# Patient Record
Sex: Male | Born: 1980
Health system: Southern US, Community
[De-identification: ages and names within clinical notes are randomized; demographics above are authoritative.]

## PROBLEM LIST (undated history)

## (undated) DIAGNOSIS — Z720 Tobacco use: Secondary | ICD-10-CM

## (undated) DIAGNOSIS — E119 Type 2 diabetes mellitus without complications: Secondary | ICD-10-CM

## (undated) DIAGNOSIS — I10 Essential (primary) hypertension: Secondary | ICD-10-CM

## (undated) DIAGNOSIS — D72829 Elevated white blood cell count, unspecified: Secondary | ICD-10-CM

## (undated) HISTORY — PX: PILONIDAL CYST EXCISION: SHX744

## (undated) HISTORY — PX: CHOLECYSTECTOMY: SHX55

## (undated) HISTORY — PX: APPENDECTOMY: SHX54

## (undated) HISTORY — PX: ABDOMINAL SURGERY: SHX537

---

## 2001-03-29 ENCOUNTER — Ambulatory Visit (HOSPITAL_BASED_OUTPATIENT_CLINIC_OR_DEPARTMENT_OTHER): Admission: RE | Admit: 2001-03-29 | Discharge: 2001-03-29 | Payer: Self-pay | Admitting: General Surgery

## 2006-01-03 ENCOUNTER — Emergency Department (HOSPITAL_COMMUNITY): Admission: EM | Admit: 2006-01-03 | Discharge: 2006-01-03 | Payer: Self-pay | Admitting: Emergency Medicine

## 2006-01-24 ENCOUNTER — Emergency Department (HOSPITAL_COMMUNITY): Admission: EM | Admit: 2006-01-24 | Discharge: 2006-01-24 | Payer: Self-pay | Admitting: Emergency Medicine

## 2011-07-26 ENCOUNTER — Inpatient Hospital Stay (HOSPITAL_COMMUNITY)
Admission: EM | Admit: 2011-07-26 | Discharge: 2011-07-29 | DRG: 603 | Disposition: A | Discharge status: Home / Self Care | Payer: MEDICAID | Attending: Internal Medicine | Admitting: Internal Medicine

## 2011-07-26 ENCOUNTER — Encounter (HOSPITAL_COMMUNITY): Payer: Self-pay | Admitting: Emergency Medicine

## 2011-07-26 DIAGNOSIS — L03319 Cellulitis of trunk, unspecified: Principal | ICD-10-CM | POA: Diagnosis present

## 2011-07-26 DIAGNOSIS — Z720 Tobacco use: Secondary | ICD-10-CM

## 2011-07-26 DIAGNOSIS — L02219 Cutaneous abscess of trunk, unspecified: Principal | ICD-10-CM

## 2011-07-26 DIAGNOSIS — N179 Acute kidney failure, unspecified: Secondary | ICD-10-CM

## 2011-07-26 DIAGNOSIS — L0291 Cutaneous abscess, unspecified: Secondary | ICD-10-CM

## 2011-07-26 DIAGNOSIS — F17200 Nicotine dependence, unspecified, uncomplicated: Secondary | ICD-10-CM

## 2011-07-26 DIAGNOSIS — L03311 Cellulitis of abdominal wall: Secondary | ICD-10-CM

## 2011-07-26 DIAGNOSIS — Z9089 Acquired absence of other organs: Secondary | ICD-10-CM

## 2011-07-26 HISTORY — DX: Tobacco use: Z72.0

## 2011-07-26 LAB — CBC WITH DIFFERENTIAL/PLATELET
Basophils Relative: 0 % (ref 0–1)
Eosinophils Absolute: 0.3 10*3/uL (ref 0.0–0.7)
Eosinophils Relative: 2 % (ref 0–5)
HCT: 45.1 % (ref 39.0–52.0)
Hemoglobin: 14.6 g/dL (ref 13.0–17.0)
Lymphocytes Relative: 15 % (ref 12–46)
Lymphs Abs: 2.3 10*3/uL (ref 0.7–4.0)
MCHC: 32.4 g/dL (ref 30.0–36.0)
Neutro Abs: 11.1 10*3/uL — ABNORMAL HIGH (ref 1.7–7.7)
WBC: 14.8 10*3/uL — ABNORMAL HIGH (ref 4.0–10.5)

## 2011-07-26 LAB — BASIC METABOLIC PANEL
Creatinine, Ser: 0.9 mg/dL (ref 0.50–1.35)
GFR calc non Af Amer: >90 mL/min (ref >90)
Potassium: 4 mEq/L (ref 3.5–5.1)

## 2011-07-26 MED ORDER — NICOTINE 21 MG/24HR TD PT24
21.0000 mg | MEDICATED_PATCH | Freq: Every day | TRANSDERMAL | Status: DC
  Administered 2011-07-26 – 2011-07-29 (×3): 21 mg via TRANSDERMAL
  Filled 2011-07-26 (×4): qty 1

## 2011-07-26 MED ORDER — OXYCODONE HCL 5 MG PO TABS
5.0000 mg | ORAL_TABLET | ORAL | Status: DC | PRN
  Administered 2011-07-26 – 2011-07-29 (×14): 5 mg via ORAL
  Filled 2011-07-26 (×14): qty 1

## 2011-07-26 MED ORDER — VANCOMYCIN HCL IN DEXTROSE 1-5 GM/200ML-% IV SOLN
1000.0000 mg | Freq: Once | INTRAVENOUS | Status: AC
  Administered 2011-07-26: 1000 mg via INTRAVENOUS
  Filled 2011-07-26: qty 200

## 2011-07-26 MED ORDER — PIPERACILLIN-TAZOBACTAM 3.375 G IVPB
3.3750 g | INTRAVENOUS | Status: AC
  Administered 2011-07-26: 3.375 g via INTRAVENOUS
  Filled 2011-07-26: qty 50

## 2011-07-26 MED ORDER — HEPARIN SODIUM (PORCINE) 5000 UNIT/ML IJ SOLN
5000.0000 [IU] | Freq: Three times a day (TID) | INTRAMUSCULAR | Status: DC
  Administered 2011-07-26 – 2011-07-29 (×9): 5000 [IU] via SUBCUTANEOUS
  Filled 2011-07-26 (×9): qty 1

## 2011-07-26 MED ORDER — PIPERACILLIN-TAZOBACTAM 3.375 G IVPB
3.3750 g | Freq: Three times a day (TID) | INTRAVENOUS | Status: DC
  Administered 2011-07-26 – 2011-07-28 (×5): 3.375 g via INTRAVENOUS
  Filled 2011-07-26 (×9): qty 50

## 2011-07-26 MED ORDER — VANCOMYCIN HCL IN DEXTROSE 1-5 GM/200ML-% IV SOLN
1000.0000 mg | Freq: Two times a day (BID) | INTRAVENOUS | Status: DC
  Administered 2011-07-26 – 2011-07-28 (×4): 1000 mg via INTRAVENOUS
  Filled 2011-07-26 (×6): qty 200

## 2011-07-26 MED ORDER — CHLORHEXIDINE GLUCONATE 4 % EX LIQD
1.0000 "application " | Freq: Once | CUTANEOUS | Status: AC
  Administered 2011-07-26: 1 via TOPICAL

## 2011-07-26 NOTE — ED Notes (Signed)
Patient with c/o abscess to left lower abdomen. Patient states he noticed it Sunday. Reports foul odor. Large abscess noted, redness around site.

## 2011-07-26 NOTE — H&P (Signed)
Roy [name] MRN: [id number] DOB/AGE: June 19, 1980 31 y.o.  Admit date: 07/26/2011 Chief Complaint: Abdominal wall redness and pain. HPI: This 31 year old man noticed the above symptoms approximately 2 days ago. His work involves the possibility of injury to the skin. Did not remember any specific skin trauma. He noticed the redness which proceeded to get worse. He has not had a fever. There is no nausea or vomiting. He is not diabetic although there is a family history of diabetes.  History reviewed. No pertinent past medical history. Past Surgical History  Procedure Date  . Appendectomy   . Abdominal surgery   . Cholecystectomy          Family history: Diabetes. Social History:  He lives with his girlfriend. He smokes one pack of cigarettes per day. He does not drink excessive alcohol.  Allergies: No Known Allergies       NWG:NFAOZ from the symptoms mentioned above,there are no other symptoms referable to all systems reviewed.  Physical Exam: Blood pressure 137/86, pulse 94, temperature 98.5 F (36.9 C), temperature source Oral, resp. rate 17, height 5\' 11"  (1.803 m), weight 99.791 kg (220 lb), SpO2 99.00%. He looks systemically well. Is not toxic or septic. Heart sounds are present and normal. Lung fields are clear. There is clear cellulitis with small abscess in the left lower quadrant abdominal wall. The abscess measures 1 cm x 2 cm in diameter and is somewhat fluctuant. I wonder if it does need incision and drainage. Otherwise his abdomen is soft and nontender. The site of the fluctuance is tender however. He is alert and orientated without any focal neurological signs.    Basename 07/26/11 1106  WBC 14.8*  NEUTROABS 11.1*  HGB 14.6  HCT 45.1  MCV 87.1  PLT 285    Basename 07/26/11 1106  NA 138  K 4.0  CL 103  CO2 28  GLUCOSE 106*  BUN 8  CREATININE 0.90  CALCIUM 9.6  MG --         No results found. Impression: 1. Cellulitis of left lower quadrant  abdominal wall with small abscess formation. 2. Tobacco abuse. 3. Borderline hyperglycemia with family history of diabetes.     Plan: 1. Admit to medical floor. 2. Intravenous antibiotics. 3. Surgical consultation. 4. Check hemoglobin A1c. Further recommendations will depend on patient's hospital progress.      Wilson Singer Pager [phone]  07/26/2011, 2:20 PM

## 2011-07-26 NOTE — ED Notes (Signed)
Dr. Karilyn Cota in room

## 2011-07-26 NOTE — ED Notes (Signed)
Attempted to call report at ext [facility], no answer

## 2011-07-26 NOTE — ED Notes (Addendum)
Nurse Angelica Chessman unable to take report and no other nurse to take report as well, will call back in 30 minutes

## 2011-07-26 NOTE — Consult Note (Signed)
Reason for Consult: Abdominal wall cellulitis Referring Physician: [facility]  Roy [name] is an 31 y.o. male.  HPI: Patient is a 31 year old white male started developing pain and swelling in the lower aspect of the abdominal wall 3 days ago. He presented emergency room and was admitted to the hospital for IV antibiotic therapy do to a possible abscess of the abdominal wall. He states he has had acne issues in the past. No apparent trauma to the abdominal wall is known.  History reviewed. No pertinent past medical history.  Past Surgical History  Procedure Date  . Appendectomy   . Abdominal surgery   . Cholecystectomy     History reviewed. No pertinent family history.  Social History:  reports that he has been smoking.  He does not have any smokeless tobacco history on file. He reports that he drinks about .6 ounces of alcohol per week. He reports that he does not use illicit drugs.  Allergies: No Known Allergies  Medications: I have reviewed the patient's current medications.  Results for orders placed during the hospital encounter of 07/26/11 (from the past 48 hour(s))  CBC WITH DIFFERENTIAL     Status: Abnormal   Collection Time   07/26/11 11:06 AM      Component Value Range Comment   WBC 14.8 (*) 4.0 - 10.5 K/uL    RBC 5.18  4.22 - 5.81 MIL/uL    Hemoglobin 14.6  13.0 - 17.0 g/dL    HCT 16.1  09.6 - 04.5 %    MCV 87.1  78.0 - 100.0 fL    MCH 28.2  26.0 - 34.0 pg    MCHC 32.4  30.0 - 36.0 g/dL    RDW 40.9  81.1 - 91.4 %    Platelets 285  150 - 400 K/uL    Neutrophils Relative 75  43 - 77 %    Neutro Abs 11.1 (*) 1.7 - 7.7 K/uL    Lymphocytes Relative 15  12 - 46 %    Lymphs Abs 2.3  0.7 - 4.0 K/uL    Monocytes Relative 7  3 - 12 %    Monocytes Absolute 1.1 (*) 0.1 - 1.0 K/uL    Eosinophils Relative 2  0 - 5 %    Eosinophils Absolute 0.3  0.0 - 0.7 K/uL    Basophils Relative 0  0 - 1 %    Basophils Absolute 0.0  0.0 - 0.1 K/uL   BASIC METABOLIC PANEL      Status: Abnormal   Collection Time   07/26/11 11:06 AM      Component Value Range Comment   Sodium 138  135 - 145 mEq/L    Potassium 4.0  3.5 - 5.1 mEq/L    Chloride 103  96 - 112 mEq/L    CO2 28  19 - 32 mEq/L    Glucose, Bld 106 (*) 70 - 99 mg/dL    BUN 8  6 - 23 mg/dL    Creatinine, Ser 7.82  0.50 - 1.35 mg/dL    Calcium 9.6  8.4 - 95.6 mg/dL    GFR calc non Af Amer >90  >90 mL/min    GFR calc Af Amer >90  >90 mL/min     No results found.  ROS: See chart Blood pressure 134/78, pulse 83, temperature 98.2 F (36.8 C), temperature source Oral, resp. rate 18, height 5\' 11"  (1.803 m), weight 99.791 kg (220 lb), SpO2 94.00%. Physical Exam: Well-developed well-nourished white male  no acute distress. Abdomen: Soft. Large ovoid area of cellulitis with induration in the lower abdominal wall region. A fluctuant area is noted in the midportion. No drainage is noted. No rigidity is noted.  Assessment/Plan: Impression: Cellulitis with probable abscess, abdominal wall Plan: Agree with antibiotic therapy. Will reassess in a.m. Should this not improve, the patient will be taken to the operating room for incision and drainage of the abdominal wall abscess. The risks and benefits of the procedure were fully explained to the patient, gave informed consent.  Roy [name] A 07/26/2011, 5:43 PM

## 2011-07-26 NOTE — ED Notes (Signed)
Please see EDP's note for further, EDP seen pt prior to RN

## 2011-07-26 NOTE — ED Provider Notes (Addendum)
History     CSN: [id number]  Arrival date & time 07/26/11  1006   First MD Initiated Contact with Patient 07/26/11 1025      Chief Complaint  Patient presents with  . Abscess    (Consider location/radiation/quality/duration/timing/severity/associated sxs/prior treatment) HPI,,,abscess and skin redness left lower abdomen for 3 days.  No history of diabetes. Pain is moderate. Palpation makes it worse. severity is moderate. No fever or chills  History reviewed. No pertinent past medical history.  Past Surgical History  Procedure Date  . Appendectomy   . Abdominal surgery   . Cholecystectomy     No family history on file.  History  Substance Use Topics  . Smoking status: Current Everyday Smoker -- 1.0 packs/day  . Smokeless tobacco: Not on file  . Alcohol Use: Yes      Review of Systems  All other systems reviewed and are negative.    Allergies  Review of patient's allergies indicates no known allergies.  Home Medications  No current outpatient prescriptions on file.  BP 137/86  Pulse 94  Temp 98.5 F (36.9 C) (Oral)  Resp 17  Ht 5\' 11"  (1.803 m)  Wt 220 lb (99.791 kg)  BMI 30.68 kg/m2  SpO2 99%  Physical Exam  Nursing note and vitals reviewed. Constitutional: He is oriented to person, place, and time. He appears well-developed and well-nourished.  HENT:  Head: Normocephalic and atraumatic.  Eyes: Conjunctivae and EOM are normal. Pupils are equal, round, and reactive to light.  Neck: Normal range of motion. Neck supple.  Cardiovascular: Normal rate and regular rhythm.   Pulmonary/Chest: Effort normal and breath sounds normal.  Abdominal: Soft. Bowel sounds are normal.  Musculoskeletal: Normal range of motion.  Neurological: He is alert and oriented to person, place, and time.  Skin:       Left lower abdomen:  Area of erythema and induration approximately 15 cm in diameter  Psychiatric: He has a normal mood and affect.    ED Course  Procedures  (including critical care time)  Labs Reviewed  CBC WITH DIFFERENTIAL - Abnormal; Notable for the following:    WBC 14.8 (*)     Neutro Abs 11.1 (*)     Monocytes Absolute 1.1 (*)     All other components within normal limits  BASIC METABOLIC PANEL - Abnormal; Notable for the following:    Glucose, Bld 106 (*)     All other components within normal limits   No results found.   1. Cellulitis and abscess       MDM  Significant cellulitis and abscess in left lower abdomen. IV vancomycin. Admit        Donnetta Hutching, MD 07/26/11 1311  Donnetta Hutching, MD 08/09/11 [phone]

## 2011-07-26 NOTE — Consult Note (Signed)
ANTIBIOTIC CONSULT NOTE - INITIAL  Pharmacy Consult for Vancomycin and Zosyn Indication: cellulitis, abdomen  No Known Allergies  Patient Measurements: Height: 5\' 11"  (180.3 cm) Weight: 220 lb (99.791 kg) IBW/kg (Calculated) : 75.3   Vital Signs: Temp: 98.5 F (36.9 C) (07/09 1020) Temp src: Oral (07/09 1020) BP: 137/86 mmHg (07/09 1020) Pulse Rate: 94  (07/09 1020) Intake/Output from previous day:   Intake/Output from this shift:    Labs:  Basename 07/26/11 1106  WBC 14.8*  HGB 14.6  PLT 285  LABCREA --  CREATININE 0.90   Estimated Creatinine Clearance: 144.5 ml/min (by C-G formula based on Cr of 0.9). No results found for this basename: VANCOTROUGH:2,VANCOPEAK:2,VANCORANDOM:2,GENTTROUGH:2,GENTPEAK:2,GENTRANDOM:2,TOBRATROUGH:2,TOBRAPEAK:2,TOBRARND:2,AMIKACINPEAK:2,AMIKACINTROU:2,AMIKACIN:2, in the last 72 hours   Microbiology: No results found for this or any previous visit (from the past 720 hour(s)).  Medical History: History reviewed. No pertinent past medical history.  Medications:  Scheduled:    . heparin  5,000 Units Subcutaneous Q8H   Assessment: 31yoM with abdominal wall cellulitis, obese with good renal fxn  Goal of Therapy:  Vancomycin trough level 10-15  Plan: Zosyn 3.375gm iv q8hrs Vancomycin 1gm iv q12hrs Labs per protocol Check trough at steady state.  Margo Aye, Altin Sease A 07/26/2011,2:25 PM

## 2011-07-27 ENCOUNTER — Encounter (HOSPITAL_COMMUNITY): Payer: Self-pay | Admitting: Anesthesiology

## 2011-07-27 ENCOUNTER — Encounter (HOSPITAL_COMMUNITY): Payer: Self-pay | Admitting: *Deleted

## 2011-07-27 ENCOUNTER — Encounter (HOSPITAL_COMMUNITY)
Admission: EM | Disposition: A | Discharge status: Home / Self Care | Payer: Self-pay | Source: Home / Self Care | Attending: Internal Medicine

## 2011-07-27 ENCOUNTER — Inpatient Hospital Stay (HOSPITAL_COMMUNITY): Payer: Self-pay | Admitting: Anesthesiology

## 2011-07-27 DIAGNOSIS — L0291 Cutaneous abscess, unspecified: Secondary | ICD-10-CM

## 2011-07-27 LAB — COMPREHENSIVE METABOLIC PANEL
ALT: 17 U/L (ref 0–53)
AST: 14 U/L (ref 0–37)
Alkaline Phosphatase: 83 U/L (ref 39–117)
BUN: 9 mg/dL (ref 6–23)
CO2: 24 mEq/L (ref 19–32)
GFR calc non Af Amer: >90 mL/min (ref >90)
Sodium: 137 mEq/L (ref 135–145)
Total Bilirubin: 0.3 mg/dL (ref 0.3–1.2)

## 2011-07-27 LAB — CBC
MCHC: 32.9 g/dL (ref 30.0–36.0)
MCV: 86.4 fL (ref 78.0–100.0)
RBC: 5 MIL/uL (ref 4.22–5.81)

## 2011-07-27 LAB — HEMOGLOBIN A1C: Mean Plasma Glucose: 131 mg/dL — ABNORMAL HIGH (ref <117)

## 2011-07-27 SURGERY — INCISION AND DRAINAGE, ABSCESS
Anesthesia: General | Site: Abdomen | Wound class: Dirty or Infected

## 2011-07-27 MED ORDER — ACETAMINOPHEN 10 MG/ML IV SOLN
INTRAVENOUS | Status: AC
  Filled 2011-07-27: qty 100

## 2011-07-27 MED ORDER — ONDANSETRON HCL 4 MG/2ML IJ SOLN
INTRAMUSCULAR | Status: AC
  Administered 2011-07-27: 4 mg
  Filled 2011-07-27: qty 2

## 2011-07-27 MED ORDER — HYDROMORPHONE HCL PF 1 MG/ML IJ SOLN
1.0000 mg | INTRAMUSCULAR | Status: DC | PRN
  Administered 2011-07-29: 1 mg via INTRAVENOUS
  Filled 2011-07-27: qty 1

## 2011-07-27 MED ORDER — PROPOFOL 10 MG/ML IV EMUL
INTRAVENOUS | Status: AC
  Filled 2011-07-27: qty 20

## 2011-07-27 MED ORDER — MIDAZOLAM HCL 2 MG/2ML IJ SOLN
1.0000 mg | INTRAMUSCULAR | Status: DC | PRN
  Administered 2011-07-27: 2 mg via INTRAVENOUS

## 2011-07-27 MED ORDER — SODIUM CHLORIDE 0.9 % IR SOLN
Status: DC | PRN
  Administered 2011-07-27: 1000 mL

## 2011-07-27 MED ORDER — SODIUM CHLORIDE 0.9 % IV SOLN
INTRAVENOUS | Status: AC
  Administered 2011-07-27 – 2011-07-28 (×2): via INTRAVENOUS

## 2011-07-27 MED ORDER — MIDAZOLAM HCL 2 MG/2ML IJ SOLN
INTRAMUSCULAR | Status: AC
  Administered 2011-07-27: 2 mg via INTRAVENOUS
  Filled 2011-07-27: qty 2

## 2011-07-27 MED ORDER — ONDANSETRON HCL 4 MG/2ML IJ SOLN: 4.0000 mg | Freq: Once | INTRAMUSCULAR | Status: DC | PRN

## 2011-07-27 MED ORDER — LIDOCAINE HCL (PF) 1 % IJ SOLN
INTRAMUSCULAR | Status: AC
  Filled 2011-07-27: qty 5

## 2011-07-27 MED ORDER — LIDOCAINE HCL 1 % IJ SOLN
INTRAMUSCULAR | Status: DC | PRN
Start: 2011-07-27 — End: 2011-07-27
  Administered 2011-07-27: 40 mg via INTRADERMAL

## 2011-07-27 MED ORDER — ACETAMINOPHEN 10 MG/ML IV SOLN
1000.0000 mg | Freq: Four times a day (QID) | INTRAVENOUS | Status: AC
  Administered 2011-07-27 – 2011-07-28 (×4): 1000 mg via INTRAVENOUS
  Filled 2011-07-27 (×4): qty 100

## 2011-07-27 MED ORDER — FENTANYL CITRATE 0.05 MG/ML IJ SOLN
INTRAMUSCULAR | Status: AC
  Administered 2011-07-27: 50 ug via INTRAVENOUS
  Filled 2011-07-27: qty 5

## 2011-07-27 MED ORDER — LACTATED RINGERS IV SOLN
INTRAVENOUS | Status: DC
  Administered 2011-07-27: 11:00:00 via INTRAVENOUS

## 2011-07-27 MED ORDER — FENTANYL CITRATE 0.05 MG/ML IJ SOLN
INTRAMUSCULAR | Status: AC
  Administered 2011-07-27: 50 ug via INTRAVENOUS
  Filled 2011-07-27: qty 2

## 2011-07-27 MED ORDER — FENTANYL CITRATE 0.05 MG/ML IJ SOLN
25.0000 ug | INTRAMUSCULAR | Status: DC | PRN
  Administered 2011-07-27 (×2): 50 ug via INTRAVENOUS

## 2011-07-27 MED ORDER — SUCCINYLCHOLINE CHLORIDE 20 MG/ML IJ SOLN
INTRAMUSCULAR | Status: DC | PRN
  Administered 2011-07-27: 120 mg via INTRAVENOUS

## 2011-07-27 MED ORDER — PROPOFOL 10 MG/ML IV BOLUS
INTRAVENOUS | Status: DC | PRN
Start: 2011-07-27 — End: 2011-07-27
  Administered 2011-07-27: 175 mg via INTRAVENOUS

## 2011-07-27 MED ORDER — FENTANYL CITRATE 0.05 MG/ML IJ SOLN
INTRAMUSCULAR | Status: DC | PRN
  Administered 2011-07-27 (×2): 50 ug via INTRAVENOUS

## 2011-07-27 MED ORDER — ONDANSETRON HCL 4 MG/2ML IJ SOLN
4.0000 mg | Freq: Once | INTRAMUSCULAR | Status: AC
  Administered 2011-07-27: 4 mg via INTRAVENOUS

## 2011-07-27 MED ORDER — ONDANSETRON HCL 4 MG/2ML IJ SOLN
INTRAMUSCULAR | Status: AC
  Administered 2011-07-27: 4 mg via INTRAVENOUS
  Filled 2011-07-27: qty 2

## 2011-07-27 MED ORDER — SUCCINYLCHOLINE CHLORIDE 20 MG/ML IJ SOLN
INTRAMUSCULAR | Status: AC
  Filled 2011-07-27: qty 1

## 2011-07-27 SURGICAL SUPPLY — 27 items
BAG HAMPER (MISCELLANEOUS) ×2 IMPLANT
BANDAGE CONFORM 2  STR LF (GAUZE/BANDAGES/DRESSINGS) IMPLANT
CLOTH BEACON ORANGE TIMEOUT ST (SAFETY) ×2 IMPLANT
COVER LIGHT HANDLE STERIS (MISCELLANEOUS) ×4 IMPLANT
ELECT REM PT RETURN 9FT ADLT (ELECTROSURGICAL) ×2
ELECTRODE REM PT RTRN 9FT ADLT (ELECTROSURGICAL) ×1 IMPLANT
GAUZE PACKING IODOFORM 2 (PACKING) ×2 IMPLANT
GLOVE BIOGEL M STRL SZ7.5 (GLOVE) ×2 IMPLANT
GLOVE BIOGEL PI IND STRL 7.0 (GLOVE) ×1 IMPLANT
GLOVE BIOGEL PI INDICATOR 7.0 (GLOVE) ×1
GLOVE EXAM NITRILE MD LF STRL (GLOVE) ×2 IMPLANT
GLOVE SS BIOGEL STRL SZ 6.5 (GLOVE) ×1 IMPLANT
GLOVE SUPERSENSE BIOGEL SZ 6.5 (GLOVE) ×1
GOWN STRL REIN XL XLG (GOWN DISPOSABLE) ×4 IMPLANT
KIT ROOM TURNOVER APOR (KITS) ×2 IMPLANT
MANIFOLD NEPTUNE II (INSTRUMENTS) ×2 IMPLANT
MARKER SKIN DUAL TIP RULER LAB (MISCELLANEOUS) ×2 IMPLANT
NS IRRIG 1000ML POUR BTL (IV SOLUTION) ×2 IMPLANT
PACK BASIC LIMB (CUSTOM PROCEDURE TRAY) IMPLANT
PACK MINOR (CUSTOM PROCEDURE TRAY) ×2 IMPLANT
PAD ABD 5X9 TENDERSORB (GAUZE/BANDAGES/DRESSINGS) IMPLANT
PAD ARMBOARD 7.5X6 YLW CONV (MISCELLANEOUS) ×2 IMPLANT
SET BASIN LINEN APH (SET/KITS/TRAYS/PACK) ×2 IMPLANT
SPONGE GAUZE 4X4 12PLY (GAUZE/BANDAGES/DRESSINGS) ×2 IMPLANT
SYR BULB IRRIGATION 50ML (SYRINGE) ×2 IMPLANT
TAPE MEDIFIX FOAM 3 (GAUZE/BANDAGES/DRESSINGS) ×2 IMPLANT
TUBE ANAEROBIC PORT A CUL  W/M (MISCELLANEOUS) ×2 IMPLANT

## 2011-07-27 NOTE — Anesthesia Preprocedure Evaluation (Addendum)
Anesthesia Evaluation  Patient identified by MRN, date of birth, ID band Patient awake    Reviewed: Allergy & Precautions, H&P , NPO status , Patient's Chart, lab work & pertinent test results  History of Anesthesia Complications Negative for: history of anesthetic complications  Airway Mallampati: III  Neck ROM: Full    Dental  (+) Teeth Intact and Poor Dentition   Pulmonary Current Smoker,  breath sounds clear to auscultation        Cardiovascular negative cardio ROS  Rhythm:Regular     Neuro/Psych    GI/Hepatic GERD-  ,  Endo/Other    Renal/GU      Musculoskeletal   Abdominal   Peds  Hematology   Anesthesia Other Findings   Reproductive/Obstetrics                           Anesthesia Physical Anesthesia Plan  ASA: II  Anesthesia Plan: General   Post-op Pain Management:    Induction: Intravenous, Rapid sequence and Cricoid pressure planned  Airway Management Planned:   Additional Equipment:   Intra-op Plan:   Post-operative Plan: Extubation in OR  Informed Consent: I have reviewed the patients History and Physical, chart, labs and discussed the procedure including the risks, benefits and alternatives for the proposed anesthesia with the patient or authorized representative who has indicated his/her understanding and acceptance.     Plan Discussed with:   Anesthesia Plan Comments:         Anesthesia Quick Evaluation

## 2011-07-27 NOTE — Transfer of Care (Signed)
Immediate Anesthesia Transfer of Care Note  Patient: Roy [name]  Procedure(s) Performed: Procedure(s) (LRB): INCISION AND DRAINAGE ABSCESS (N/A)  Patient Location: PACU  Anesthesia Type: General  Level of Consciousness: awake, alert  and oriented  Airway & Oxygen Therapy: Patient Spontanous Breathing and Patient connected to face mask oxygen  Post-op Assessment: Report given to PACU RN  Post vital signs: Reviewed and stable  Complications: No apparent anesthesia complications

## 2011-07-27 NOTE — Progress Notes (Signed)
[facility] PROGRESS NOTE  Roy [name] WUJ:[id number] DOB: [birth year] DOA: 07/26/2011 PCP: No primary provider on file.  Assessment/Plan: Principal Problem:  *Cellulitis of abdominal wall Active Problems:  Tobacco abuse  1. Abdominal wall abscess.  Patient is on vancomycin and zosyn.  He was seen by Dr. Lovell Sheehan and underwent incision and drainage of abscess today.  We will continue IV antibiotics for now and transition to po on discharge.  WBC trending up today prior to surgery.  Will plan on discharge home when ok with surgery.  He will likely need home health for wound care.  Code Status: full code Family Communication: discussed with patient and family at bedside Disposition Plan: discharge home with home health  Brief narrative: This gentleman was admitted to the hospital with abdominal wall redness and pain. He was found to have a cellulitis/abdominal wall abscess. He was admitted for further treatment.  Consultants:  General surgery, Dr. Lovell Sheehan.  Procedures:  Incision and drainage of abdominal wall abscess on 7/10  Antibiotics:  Vancomycin 7/9  Zosyn 7/9  HPI/Subjective: Patient is feeling better. Pain is controlled. Does not have any specific complaints.  Objective: Filed Vitals:   07/27/11 1230 07/27/11 1245 07/27/11 1315 07/27/11 1338  BP: 136/94 134/89 128/82 137/77  Pulse: 85 93 81 89  Temp:   97.9 F (36.6 C) 97.3 F (36.3 C)  TempSrc:   Oral Axillary  Resp: 21 13 16 14   Height:      Weight:      SpO2: 95% 95% 94% 97%    Intake/Output Summary (Last 24 hours) at 07/27/11 1516 Last data filed at 07/27/11 1243  Gross per 24 hour  Intake   1060 ml  Output      5 ml  Net   1055 ml    Exam:   General: Lying in bed, does not appear to be in any acute distress  Cardiovascular: S1, S2, regular rate and rhythm  Respiratory: Clear to auscultation bilaterally  Abdomen: Soft, erythema the left lower quadrant with overlying dressing, bowel  sounds are active  Data Reviewed: Basic Metabolic Panel:  Lab 07/27/11 9562 07/26/11 1106  NA 137 138  K 4.0 4.0  CL 103 103  CO2 24 28  GLUCOSE 105* 106*  BUN 9 8  CREATININE 1.01 0.90  CALCIUM 9.2 9.6  MG -- --  PHOS -- --   Liver Function Tests:  Lab 07/27/11 0458  AST 14  ALT 17  ALKPHOS 83  BILITOT 0.3  PROT 6.6  ALBUMIN 3.2*   No results found for this basename: LIPASE:5,AMYLASE:5 in the last 168 hours No results found for this basename: AMMONIA:5 in the last 168 hours CBC:  Lab 07/27/11 0458 07/26/11 1106  WBC 17.2* 14.8*  NEUTROABS -- 11.1*  HGB 14.2 14.6  HCT 43.2 45.1  MCV 86.4 87.1  PLT 260 285   Cardiac Enzymes: No results found for this basename: CKTOTAL:5,CKMB:5,CKMBINDEX:5,TROPONINI:5 in the last 168 hours BNP (last 3 results) No results found for this basename: PROBNP:3 in the last 8760 hours CBG: No results found for this basename: GLUCAP:5 in the last 168 hours  Recent Results (from the past 240 hour(s))  SURGICAL PCR SCREEN     Status: Normal   Collection Time   07/26/11 11:26 PM      Component Value Range Status Comment   MRSA, PCR NEGATIVE  NEGATIVE Final    Staphylococcus aureus NEGATIVE  NEGATIVE Final      Studies: No results found.  Scheduled Meds:   . acetaminophen      . acetaminophen  1,000 mg Intravenous Q6H  . chlorhexidine  1 application Topical Once  . heparin  5,000 Units Subcutaneous Q8H  . lidocaine      . nicotine  21 mg Transdermal Daily  . ondansetron      . ondansetron (ZOFRAN) IV  4 mg Intravenous Once  . piperacillin-tazobactam (ZOSYN)  IV  3.375 g Intravenous NOW  . piperacillin-tazobactam (ZOSYN)  IV  3.375 g Intravenous Q8H  . propofol      . succinylcholine      . vancomycin  1,000 mg Intravenous Q12H   Continuous Infusions:   . DISCONTD: lactated ringers 75 mL/hr at 07/27/11 1049     MEMON,JEHANZEB [facility] Pager [phone]  If 7PM-7AM, please contact  night-coverage [email or web address] Password [email or web address] 07/27/2011, 3:16 PM   LOS: 1 day

## 2011-07-27 NOTE — Consult Note (Signed)
ANTIBIOTIC CONSULT NOTE - INITIAL  Pharmacy Consult for Vancomycin and Zosyn Indication: cellulitis, abdominal wall abscess, s/p debridement  No Known Allergies  Patient Measurements: Height: 5\' 11"  (180.3 cm) Weight: 220 lb (99.791 kg) IBW/kg (Calculated) : 75.3   Vital Signs: Temp: 97.9 F (36.6 C) (07/10 1315) Temp src: Oral (07/10 1315) BP: 128/82 mmHg (07/10 1315) Pulse Rate: 81  (07/10 1315) Intake/Output from previous day: 07/09 0701 - 07/10 0700 In: 460 [P.O.:460] Out: -  Intake/Output from this shift: Total I/O In: 600 [P.O.:200; I.V.:400] Out: 5 [Blood:5]  Labs:  [facility] 07/27/11 0458 07/26/11 1106  WBC 17.2* 14.8*  HGB 14.2 14.6  PLT 260 285  LABCREA -- --  CREATININE 1.01 0.90   Estimated Creatinine Clearance: 128.7 ml/min (by C-G formula based on Cr of 1.01). No results found for this basename: VANCOTROUGH:2,VANCOPEAK:2,VANCORANDOM:2,GENTTROUGH:2,GENTPEAK:2,GENTRANDOM:2,TOBRATROUGH:2,TOBRAPEAK:2,TOBRARND:2,AMIKACINPEAK:2,AMIKACINTROU:2,AMIKACIN:2, in the last 72 hours   Microbiology: Recent Results (from the past 720 hour(s))  SURGICAL PCR SCREEN     Status: Normal   Collection Time   07/26/11 11:26 PM      Component Value Range Status Comment   MRSA, PCR NEGATIVE  NEGATIVE Final    Staphylococcus aureus NEGATIVE  NEGATIVE Final    Medical History: Past Medical History  Diagnosis Date  . No pertinent past medical history    Medications:  Scheduled:     . acetaminophen      . acetaminophen  1,000 mg Intravenous Q6H  . chlorhexidine  1 application Topical Once  . heparin  5,000 Units Subcutaneous Q8H  . lidocaine      . nicotine  21 mg Transdermal Daily  . ondansetron      . ondansetron (ZOFRAN) IV  4 mg Intravenous Once  . piperacillin-tazobactam (ZOSYN)  IV  3.375 g Intravenous NOW  . piperacillin-tazobactam (ZOSYN)  IV  3.375 g Intravenous Q8H  . propofol      . succinylcholine      . vancomycin  1,000 mg Intravenous Q12H    Assessment: 31yoM with abdominal wall cellulitis, obese with good renal fxn  Goal of Therapy:  Vancomycin trough level 10-15  Plan: Zosyn 3.375gm iv q8hrs Vancomycin 1gm iv q12hrs Labs per protocol Check trough at steady state.  Margo Aye, Nitisha Civello A 07/27/2011,1:31 PM

## 2011-07-27 NOTE — Anesthesia Postprocedure Evaluation (Signed)
  Anesthesia Post-op Note  Patient: Roy [name]  Procedure(s) Performed: Procedure(s) (LRB): INCISION AND DRAINAGE ABSCESS (N/A)  Patient Location: PACU  Anesthesia Type: General  Level of Consciousness: oriented  Airway and Oxygen Therapy: Patient Spontanous Breathing and Patient connected to face mask oxygen  Post-op Pain: mild  Post-op Assessment: Post-op Vital signs reviewed, Patient's Cardiovascular Status Stable, Respiratory Function Stable, Patent Airway and No signs of Nausea or vomiting  Post-op Vital Signs: Reviewed and stable  Complications: No apparent anesthesia complications

## 2011-07-27 NOTE — Anesthesia Procedure Notes (Signed)
Procedure Name: Intubation Date/Time: 07/27/2011 11:40 AM Performed by: Glynn Octave E Pre-anesthesia Checklist: Patient identified, Patient being monitored, Timeout performed, Emergency Drugs available and Suction available Patient Re-evaluated:Patient Re-evaluated prior to inductionOxygen Delivery Method: Circle System Utilized Preoxygenation: Pre-oxygenation with 100% oxygen Intubation Type: IV induction, Rapid sequence and Cricoid Pressure applied Ventilation: Mask ventilation without difficulty Laryngoscope Size: Mac and 3 Grade View: Grade I Tube type: Oral Tube size: 7.0 mm Number of attempts: 1 Airway Equipment and Method: stylet Placement Confirmation: ETT inserted through vocal cords under direct vision,  positive ETCO2 and breath sounds checked- equal and bilateral Secured at: 21 cm Tube secured with: Tape Dental Injury: Teeth and Oropharynx as per pre-operative assessment

## 2011-07-27 NOTE — Op Note (Signed)
Patient:  Roy [name]  DOB:  1980-08-23  MRN:  [id number]   Preop Diagnosis:  Abdominal wall abscess  Postop Diagnosis:  Same  Procedure:  Incision and drainage o 2 inch iodoform new gauze. A dry sterile dressing was then applied.  All tape and needle counts were correct the end of the procedure. Patient was awakened and transferred to PACU in stable condition. f abdominal wall abscess  Surgeon:  Franky Macho, M.D.  Anes:  General endotracheal  Indications:  Patient is a 31 year old white male who presents with an abdominal wall abscess. The risks and benefits of the procedure including recurrence of the abscess were fully explained to the patient, gave informed consent.  Procedure note:  Patient is placed the supine position. After induction of general endotracheal anesthesia, the abdomen was prepped and draped using the usual sterile technique with Betadine. Surgical site confirmation was performed. The was some drainage from the wound at the time of the surgical prep. Aerobic and anaerobic cultures were taken and sent to microbiology. Incision was made transversely over the abscess cavity which measured greater than 8 cm in its greatest diameter. The abscess was subcutaneous in nature. It did not appear to extend down to the abdominal wall. Any necrotic tissue was debrided. The cavity was curetted without difficulty. The wound was irrigated normal saline. The wound was packed with  Complications:  None  EBL:  Minimal  Specimen:  Aerobic and anaerobic cultures of abdominal wall abscess

## 2011-07-28 DIAGNOSIS — N179 Acute kidney failure, unspecified: Secondary | ICD-10-CM | POA: Diagnosis present

## 2011-07-28 DIAGNOSIS — L02219 Cutaneous abscess of trunk, unspecified: Principal | ICD-10-CM | POA: Diagnosis present

## 2011-07-28 LAB — CBC
HCT: 42.8 % (ref 39.0–52.0)
Hemoglobin: 14.2 g/dL (ref 13.0–17.0)
MCV: 86.3 fL (ref 78.0–100.0)
RBC: 4.96 MIL/uL (ref 4.22–5.81)
WBC: 16.1 10*3/uL — ABNORMAL HIGH (ref 4.0–10.5)

## 2011-07-28 LAB — BASIC METABOLIC PANEL
CO2: 26 mEq/L (ref 19–32)
Chloride: 105 mEq/L (ref 96–112)
Potassium: 4 mEq/L (ref 3.5–5.1)
Sodium: 140 mEq/L (ref 135–145)

## 2011-07-28 MED ORDER — MUPIROCIN CALCIUM 2 % EX CREA
TOPICAL_CREAM | Freq: Two times a day (BID) | CUTANEOUS | Status: DC
  Administered 2011-07-28 – 2011-07-29 (×3): via TOPICAL
  Filled 2011-07-28 (×2): qty 15

## 2011-07-28 MED ORDER — DOXYCYCLINE HYCLATE 100 MG PO TABS
100.0000 mg | ORAL_TABLET | Freq: Two times a day (BID) | ORAL | Status: DC
  Administered 2011-07-28 – 2011-07-29 (×3): 100 mg via ORAL
  Filled 2011-07-28 (×3): qty 1

## 2011-07-28 NOTE — Anesthesia Postprocedure Evaluation (Signed)
Anesthesia Post Note  Patient: Roy [name]  Procedure(s) Performed: Procedure(s) (LRB): INCISION AND DRAINAGE ABSCESS (N/A)  Anesthesia type: General  Patient location: 335  Post pain: Pain level controlled  Post assessment: Post-op Vital signs reviewed, Patient's Cardiovascular Status Stable, Respiratory Function Stable, Patent Airway, No signs of Nausea or vomiting and Pain level controlled  Last Vitals:  Filed Vitals:   07/28/11 0523  BP: 124/83  Pulse: 80  Temp: 36.9 C  Resp: 18    Post vital signs: Reviewed and stable  Level of consciousness: awake and alert   Complications: No apparent anesthesia complications

## 2011-07-28 NOTE — Care Management Note (Signed)
    Page 1 of 1   07/28/2011     11:17:17 AM   CARE MANAGEMENT NOTE 07/28/2011  Patient:  Roy [name], Roy [name]   Account Number:  [id number]  Date Initiated:  07/27/2011  Documentation initiated by:  Rosemary Holms  Subjective/Objective Assessment:   Pt admitted from home where he lives with his fiance and three children. Admitted with abdominal wall cellulitis     Action/Plan:   Per pt, Dr. Lovell Sheehan cleaned up area today and will be on IV antiobiotics for 24 hrs. He does not have [name] PCP. Goes to [facility]gent [facility] ctr. CM spoke to him about [facility] or since he works, going to establish himself at the [employer].   Anticipated DC Date:  07/28/2011   Anticipated DC Plan:  HOME/SELF CARE  In-house referral  Financial Counselor      DC Planning Services  CM consult      Choice offered to / List presented to:             Status of service:  Completed, signed off Medicare Important Message given?   (If response is "NO", the following Medicare IM given date fields will be blank) Date Medicare IM given:   Date Additional Medicare IM given:    Discharge Disposition:  HOME/SELF CARE  Per UR Regulation:    If discussed at Long Length of Stay Meetings, dates discussed:    Comments:  07/28/11 1100 Markez Dowland RN BSN CM Pt ready to DC ASAP. Dr. Lovell Sheehan reviewed wound care with finance who states she is comfortable with dressing change. DC home, no HH needs identified.  07/27/11 1400 Taaliyah Delpriore Leanord Hawking RN BSN CM

## 2011-07-28 NOTE — Progress Notes (Signed)
[facility] PROGRESS NOTE  NUEL DEJAYNES NWG:[id number] DOB: 07/28/1980 DOA: 07/26/2011 PCP: No primary provider on file.  Assessment/Plan: Principal Problem:  *Cellulitis and abscess of trunk Active Problems:  Tobacco abuse  Acute renal failure  1. Cellulitis and abdominal wall abscess. Patient is status post incision and drainage yesterday. He's not been febrile. He is followed by Dr. Lovell Sheehan today and dressing was changed. His wife has been instructed on how to change dressings. He'll need to continue on antibiotics and followup with Dr. Lovell Sheehan in one week. 2. Acute renal failure. Patient has had a decline in his renal function, likely related to vancomycin. We will check the vancomycin level, and discontinue offending drug. He'll be changed to oral doxycycline. We'll repeat labs in the morning and if renal function is improving then he can likely discharge home tomorrow. Continue IV fluids  Code Status: full code  Family Communication: discussed with patient and family at bedside  Disposition Plan: discharge home with home health   Brief narrative:  This gentleman was admitted to the hospital with abdominal wall redness and pain. He was found to have a cellulitis/abdominal wall abscess. He was admitted for further treatment.   Consultants:  General surgery, Dr. Lovell Sheehan.  Procedures:  Incision and drainage of abdominal wall abscess on 7/10  Antibiotics:  Vancomycin 7/9-7/11  Zosyn 7/9-7/11 Doxycycline 7/11  HPI/Subjective: Has intermittent pain at incision site, but overall feels better. No new complaints  Objective: Filed Vitals:   07/27/11 1338 07/27/11 1606 07/27/11 2113 07/28/11 0523  BP: 137/77 121/76 120/73 124/83  Pulse: 89 86 84 80  Temp: 97.3 F (36.3 C) 98 F (36.7 C) 98.3 F (36.8 C) 98.4 F (36.9 C)  TempSrc: Axillary Oral Oral Oral  Resp: 14 12 16 18   Height:      Weight:      SpO2: 97% 97% 97% 98%    Intake/Output Summary (Last 24 hours) at  07/28/11 1341 Last data filed at 07/28/11 1200  Gross per 24 hour  Intake    840 ml  Output      0 ml  Net    840 ml    Exam:   General:  NAD, sitting up in bed   Cardiovascular: s1, s2, rrr  Respiratory: CTA B  Abdomen: soft, NT, BS+, dressing in LLQ  Data Reviewed: Basic Metabolic Panel:  Lab 07/28/11 5784 07/27/11 0458 07/26/11 1106  NA 140 137 138  K 4.0 4.0 4.0  CL 105 103 103  CO2 26 24 28   GLUCOSE 126* 105* 106*  BUN 13 9 8   CREATININE 1.82* 1.01 0.90  CALCIUM 9.2 9.2 9.6  MG -- -- --  PHOS -- -- --   Liver Function Tests:  Lab 07/27/11 0458  AST 14  ALT 17  ALKPHOS 83  BILITOT 0.3  PROT 6.6  ALBUMIN 3.2*   No results found for this basename: LIPASE:5,AMYLASE:5 in the last 168 hours No results found for this basename: AMMONIA:5 in the last 168 hours CBC:  Lab 07/28/11 0609 07/27/11 0458 07/26/11 1106  WBC 16.1* 17.2* 14.8*  NEUTROABS -- -- 11.1*  HGB 14.2 14.2 14.6  HCT 42.8 43.2 45.1  MCV 86.3 86.4 87.1  PLT 274 260 285   Cardiac Enzymes: No results found for this basename: CKTOTAL:5,CKMB:5,CKMBINDEX:5,TROPONINI:5 in the last 168 hours BNP (last 3 results) No results found for this basename: PROBNP:3 in the last 8760 hours CBG: No results found for this basename: GLUCAP:5 in the last 168 hours  Recent Results (from the past 240 hour(s))  SURGICAL PCR SCREEN     Status: Normal   Collection Time   07/26/11 11:26 PM      Component Value Range Status Comment   MRSA, PCR NEGATIVE  NEGATIVE Final    Staphylococcus aureus NEGATIVE  NEGATIVE Final   CULTURE, ROUTINE-ABSCESS     Status: Normal (Preliminary result)   Collection Time   07/27/11 11:57 AM      Component Value Range Status Comment   Specimen Description ABSCESS ABDOMINAL WALL   Final    Special Requests NONE   Final    Gram Stain     Final    Value: FEW WBC PRESENT,BOTH PMN AND MONONUCLEAR     NO SQUAMOUS EPITHELIAL CELLS SEEN     NO ORGANISMS SEEN   Culture NO GROWTH 1 DAY    Final    Report Status PENDING   Incomplete   ANAEROBIC CULTURE     Status: Normal (Preliminary result)   Collection Time   07/27/11 11:57 AM      Component Value Range Status Comment   Specimen Description ABSCESS ABDOMINAL WALL   Final    Special Requests NONE   Final    Gram Stain     Final    Value: ABUNDANT WBC PRESENT,BOTH PMN AND MONONUCLEAR     NO SQUAMOUS EPITHELIAL CELLS SEEN     FEW GRAM POSITIVE COCCI IN PAIRS     IN CLUSTERS   Culture     Final    Value: NO ANAEROBES ISOLATED; CULTURE IN PROGRESS FOR 5 DAYS   Report Status PENDING   Incomplete      Studies: No results found.  Scheduled Meds:    . acetaminophen      . acetaminophen  1,000 mg Intravenous Q6H  . doxycycline  100 mg Oral Q12H  . heparin  5,000 Units Subcutaneous Q8H  . lidocaine      . mupirocin cream   Topical BID  . nicotine  21 mg Transdermal Daily  . propofol      . succinylcholine      . DISCONTD: piperacillin-tazobactam (ZOSYN)  IV  3.375 g Intravenous Q8H  . DISCONTD: vancomycin  1,000 mg Intravenous Q12H   Continuous Infusions:    . sodium chloride Stopped (07/28/11 1242)     Ronon Ferger [facility] Pager [phone]  If 7PM-7AM, please contact night-coverage [email or web address] Password [email or web address] 07/28/2011, 1:41 PM   LOS: 2 days

## 2011-07-28 NOTE — Addendum Note (Signed)
Addendum  created 07/28/11 1118 by Franco Nones, CRNA   Modules edited:Notes Section

## 2011-07-28 NOTE — Progress Notes (Signed)
1 Day Post-Op  Subjective: Mild incisional pain.  Objective: Vital signs in last 24 hours: Temp:  [97.3 F (36.3 C)-98.4 F (36.9 C)] 98.4 F (36.9 C) (07/11 0523) Pulse Rate:  [78-93] 80  (07/11 0523) Resp:  [9-26] 18  (07/11 0523) BP: (119-137)/(64-94) 124/83 mmHg (07/11 0523) SpO2:  [90 %-98 %] 98 % (07/11 0523) FiO2 (%):  [10 %] 10 % (07/10 1215) Last BM Date: 07/27/11  Intake/Output from previous day: 07/10 0701 - 07/11 0700 In: 1140 [P.O.:740; I.V.:400] Out: 5 [Blood:5] Intake/Output this shift:    Abdominal wall skin wound healing well by secondary intention. No purulent drainage present. Superficial in nature and not extending to the abdominal wall musculature.  Lab Results:   [facility] 07/28/11 0609 07/27/11 0458  WBC 16.1* 17.2*  HGB 14.2 14.2  HCT 42.8 43.2  PLT 274 260   BMET  Basename 07/28/11 0609 07/27/11 0458  NA 140 137  K 4.0 4.0  CL 105 103  CO2 26 24  GLUCOSE 126* 105*  BUN 13 9  CREATININE 1.82* 1.01  CALCIUM 9.2 9.2   PT/INR No results found for this basename: LABPROT:2,INR:2 in the last 72 hours  Studies/Results: No results found.  Anti-infectives: Anti-infectives     Start     Dose/Rate Route Frequency Ordered Stop   07/26/11 2200  piperacillin-tazobactam (ZOSYN) IVPB 3.375 g       3.375 g 12.5 mL/hr over 240 Minutes Intravenous Every 8 hours 07/26/11 1423     07/26/11 2200   vancomycin (VANCOCIN) IVPB 1000 mg/200 mL premix        1,000 mg 200 mL/hr over 60 Minutes Intravenous Every 12 hours 07/26/11 1424     07/26/11 1430  piperacillin-tazobactam (ZOSYN) IVPB 3.375 g       3.375 g 12.5 mL/hr over 240 Minutes Intravenous NOW 07/26/11 1422 07/26/11 1925   07/26/11 1115   vancomycin (VANCOCIN) IVPB 1000 mg/200 mL premix        1,000 mg 200 mL/hr over 60 Minutes Intravenous  Once 07/26/11 1101 07/26/11 1218          Assessment/Plan: s/p Procedure(s): INCISION AND DRAINAGE ABSCESS Impression: Abdominal wall abscess,  resolving. I have adjusted his wound care to soap and water twice a day with application of Bactroban cream. This wound is superficial and does not need packing. I am okay with discharge of this patient. Will follow up in my office in one week. Final cultures still pending.  LOS: 2 days    Clotilde Loth A 07/28/2011

## 2011-07-29 LAB — CBC
HCT: 44.7 % (ref 39.0–52.0)
Hemoglobin: 14.4 g/dL (ref 13.0–17.0)
MCH: 28.3 pg (ref 26.0–34.0)
MCHC: 32.2 g/dL (ref 30.0–36.0)
RBC: 5.08 MIL/uL (ref 4.22–5.81)

## 2011-07-29 LAB — BASIC METABOLIC PANEL
BUN: 12 mg/dL (ref 6–23)
Chloride: 106 mEq/L (ref 96–112)
GFR calc Af Amer: 53 mL/min — ABNORMAL LOW (ref >90)
Glucose, Bld: 91 mg/dL (ref 70–99)
Potassium: 4.9 mEq/L (ref 3.5–5.1)

## 2011-07-29 MED ORDER — DOXYCYCLINE HYCLATE 100 MG PO TABS: 100.0000 mg | ORAL_TABLET | Freq: Two times a day (BID) | ORAL | Status: AC

## 2011-07-29 MED ORDER — MUPIROCIN CALCIUM 2 % EX CREA: TOPICAL_CREAM | Freq: Two times a day (BID) | CUTANEOUS | Status: AC

## 2011-07-29 MED ORDER — HYDROCODONE-ACETAMINOPHEN 5-500 MG PO TABS: 1.0000 | ORAL_TABLET | Freq: Four times a day (QID) | ORAL | Status: AC | PRN

## 2011-07-29 NOTE — Progress Notes (Signed)
Pt discharged with instructions, care notes and prescriptions.  He verbalized understanding.  Pt left the floor ambulating with staff and family in stable condition.  All question voiced where answered.

## 2011-07-29 NOTE — Discharge Summary (Signed)
Physician Discharge Summary  Roy [name] ZOX:[id number] DOB: [birth year] DOA: 07/26/2011  PCP: No primary provider on file.  Admit date: 07/26/2011 Discharge date: 07/29/2011  Recommendations for Outpatient Follow-up:  1. BMET on next appointment with Dr. Kristian Covey as outpatient 2. Follow up with Dr. Lovell Sheehan in 1 week for wound check  Discharge Diagnoses:  Principal Problem:  *Cellulitis and abscess of trunk Active Problems:  Tobacco abuse  Acute renal failure   Discharge Condition: improved   Diet recommendation: low carb, low salt  History of present illness:  This 31 year old man noticed the above symptoms approximately 2 days ago. His work involves the possibility of injury to the skin. Did not remember any specific skin trauma. He noticed the redness which proceeded to get worse. He has not had a fever. There is no nausea or vomiting. He is not diabetic although there is a family history of diabetes.   Hospital Course:  This gentleman was admitted to the hospital with redness and fever on his abdomen. Patient does have a significant cellulitis and abscess of his trunk. He was started on intravenous antibiotics with vancomycin and Zosyn. He was seen by Dr. Lovell Sheehan from general surgery and underwent incision and drainage on 7/10 without any immediate complications. Dr. Lovell Sheehan followed up and recommended changing the patient to oral antibiotics with close outpatient followup. He is no longer febrile and his cellulitis appears to be improving clinically. His wife was taught to do wound care and he'll see Dr. Lovell Sheehan next week.  Patient did develop some acute renal failure with his creatinine trending up to 1.8. This was felt to be likely due to vancomycin. His IV antibiotics were discontinued and he was continued on IV fluids. His creatinine has plateaued at 1.8. I have discussed the case with Dr. Kristian Covey with nephrology and recommendations are to discharge the patient and have him followup  next week to have her repeat basic metabolic panel checked  Procedures:  Incision and drainage of abscess on 7/10  Consultations:  General Surgery, Dr. Lovell Sheehan  Discharge Exam: Filed Vitals:   07/29/11 0817  BP:   Pulse: 83  Temp:   Resp:    Filed Vitals:   07/28/11 1532 07/28/11 2100 07/29/11 0535 07/29/11 0817  BP: 129/81 143/88 130/83   Pulse: 81 92 68 83  Temp: 98.3 F (36.8 C) 98.7 F (37.1 C) 98.1 F (36.7 C)   TempSrc: Oral Oral Oral   Resp: 16 18 20    Height:      Weight:      SpO2: 98% 97% 98% 97%   General: Sitting up in chair, no significant complaints, anxious to go home Cardiovascular: S1, S2, regular rate and rhythm Respiratory: Clear to auscultation bilaterally  Discharge Instructions  Discharge Orders    Future Orders Please Complete By Expires   Diet - low sodium heart healthy      Increase activity slowly      Call MD for:  temperature >100.4      Call MD for:  severe uncontrolled pain      Call MD for:  redness, tenderness, or signs of infection (pain, swelling, redness, odor or green/yellow discharge around incision site)        Medication List  As of 07/29/2011  1:42 PM   TAKE these medications         doxycycline 100 MG tablet   Commonly known as: VIBRA-TABS   Take 1 tablet (100 mg total) by mouth every 12 (twelve) hours.  HYDROcodone-acetaminophen 5-500 MG per tablet   Commonly known as: VICODIN   Take 1-2 tablets by mouth every 6 (six) hours as needed for pain.      mupirocin cream 2 %   Commonly known as: BACTROBAN   Apply topically 2 (two) times daily. Apply to wound BID           Follow-up Information    Follow up with Dalia Heading, MD. Schedule an appointment as soon as possible for a visit on 08/02/2011.   Contact information:   [address] [place] [address] [phone]       Follow up with [facility], MD. (nephrology as scheduled)    Contact information:   [address] [place] [address] [phone]           The results of significant diagnostics from this hospitalization (including imaging, microbiology, ancillary and laboratory) are listed below for reference.    Significant Diagnostic Studies: No results found.  Microbiology: Recent Results (from the past 240 hour(s))  SURGICAL PCR SCREEN     Status: Normal   Collection Time   07/26/11 11:26 PM      Component Value Range Status Comment   MRSA, PCR NEGATIVE  NEGATIVE Final    Staphylococcus aureus NEGATIVE  NEGATIVE Final   CULTURE, ROUTINE-ABSCESS     Status: Normal (Preliminary result)   Collection Time   07/27/11 11:57 AM      Component Value Range Status Comment   Specimen Description ABSCESS ABDOMINAL WALL   Final    Special Requests NONE   Final    Gram Stain     Final    Value: FEW WBC PRESENT,BOTH PMN AND MONONUCLEAR     NO SQUAMOUS EPITHELIAL CELLS SEEN     NO ORGANISMS SEEN   Culture FEW ENTEROCOCCUS SPECIES   Final    Report Status PENDING   Incomplete   ANAEROBIC CULTURE     Status: Normal (Preliminary result)   Collection Time   07/27/11 11:57 AM      Component Value Range Status Comment   Specimen Description ABSCESS ABDOMINAL WALL   Final    Special Requests NONE   Final    Gram Stain     Final    Value: ABUNDANT WBC PRESENT,BOTH PMN AND MONONUCLEAR     NO SQUAMOUS EPITHELIAL CELLS SEEN     FEW GRAM POSITIVE COCCI IN PAIRS     IN CLUSTERS   Culture     Final    Value: NO ANAEROBES ISOLATED; CULTURE IN PROGRESS FOR 5 DAYS   Report Status PENDING   Incomplete      Labs: Basic Metabolic Panel:  Lab 07/29/11 5284 07/28/11 0609 07/27/11 0458 07/26/11 1106  NA 143 140 137 138  K 4.9 4.0 4.0 4.0  CL 106 105 103 103  CO2 28 26 24 28   GLUCOSE 91 126* 105* 106*  BUN 12 13 9 8   CREATININE 1.89* 1.82* 1.01 0.90  CALCIUM 9.7 9.2 9.2 9.6  MG -- -- -- --  PHOS -- -- -- --   Liver Function Tests:  Lab 07/27/11 0458  AST 14  ALT 17  ALKPHOS 83    BILITOT 0.3  PROT 6.6  ALBUMIN 3.2*   No results found for this basename: LIPASE:5,AMYLASE:5 in the last 168 hours No results found for this basename: AMMONIA:5 in the last 168 hours CBC:  Lab 07/29/11 0459 07/28/11 0609 07/27/11 0458 07/26/11 1106  WBC  16.2* 16.1* 17.2* 14.8*  NEUTROABS -- -- -- 11.1*  HGB 14.4 14.2 14.2 14.6  HCT 44.7 42.8 43.2 45.1  MCV 88.0 86.3 86.4 87.1  PLT 289 274 260 285   Cardiac Enzymes: No results found for this basename: CKTOTAL:5,CKMB:5,CKMBINDEX:5,TROPONINI:5 in the last 168 hours BNP: BNP (last 3 results) No results found for this basename: PROBNP:3 in the last 8760 hours CBG: No results found for this basename: GLUCAP:5 in the last 168 hours  Time coordinating discharge:  Signed:  MEMON,JEHANZEB  [facility] 07/29/2011, 1:42 PM

## 2011-07-30 LAB — CULTURE, ROUTINE-ABSCESS

## 2011-08-01 LAB — ANAEROBIC CULTURE

## 2011-08-24 NOTE — Progress Notes (Signed)
UR chart review completed.  

## 2011-10-04 DIAGNOSIS — R079 Chest pain, unspecified: Secondary | ICD-10-CM

## 2015-05-08 ENCOUNTER — Encounter: Payer: Self-pay | Admitting: Family Medicine

## 2015-05-08 ENCOUNTER — Ambulatory Visit (INDEPENDENT_AMBULATORY_CARE_PROVIDER_SITE_OTHER): Payer: BLUE CROSS/BLUE SHIELD | Admitting: Family Medicine

## 2015-05-08 VITALS — BP 143/99 | HR 92 | Temp 97.6°F | Ht 71.0 in | Wt 186.0 lb

## 2015-05-08 DIAGNOSIS — Z720 Tobacco use: Secondary | ICD-10-CM | POA: Diagnosis not present

## 2015-05-08 DIAGNOSIS — E669 Obesity, unspecified: Secondary | ICD-10-CM

## 2015-05-08 DIAGNOSIS — L732 Hidradenitis suppurativa: Secondary | ICD-10-CM | POA: Diagnosis not present

## 2015-05-08 LAB — BAYER DCA HB A1C WAIVED: HB A1C: 6.3 % (ref <7.0)
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
## 2015-05-08 NOTE — Patient Instructions (Signed)
Great to meet you!  Lets get you to keep a blood pressure log and come back in 3 months to review it. We only need a few readings a week.   Try to start walking daily for 20 minutes or more.   Please call if you have any concerns or need to be seen

## 2015-05-09 LAB — CMP14+EGFR
ALT: 42 IU/L (ref 0–44)
AST: 26 IU/L (ref 0–40)
Albumin/Globulin Ratio: 1.7 (ref 1.2–2.2)
Albumin: 4.2 g/dL (ref 3.5–5.5)
Alkaline Phosphatase: 85 IU/L (ref 39–117)
BUN/Creatinine Ratio: 13 (ref 9–20)
BUN: 12 mg/dL (ref 6–20)
Bilirubin Total: 0.2 mg/dL (ref 0.0–1.2)
CALCIUM: 9.2 mg/dL (ref 8.7–10.2)
CO2: 21 mmol/L (ref 18–29)
CREATININE: 0.95 mg/dL (ref 0.76–1.27)
Chloride: 101 mmol/L (ref 96–106)
GFR calc Af Amer: 120 mL/min/{1.73_m2} (ref >59)
GFR, EST NON AFRICAN AMERICAN: 104 mL/min/{1.73_m2} (ref >59)
GLOBULIN, TOTAL: 2.5 g/dL (ref 1.5–4.5)
Glucose: 105 mg/dL — ABNORMAL HIGH (ref 65–99)
Potassium: 4.2 mmol/L (ref 3.5–5.2)
SODIUM: 140 mmol/L (ref 134–144)
TOTAL PROTEIN: 6.7 g/dL (ref 6.0–8.5)

## 2015-05-09 LAB — CBC WITH DIFFERENTIAL/PLATELET
BASOS: 0 %
Basophils Absolute: 0.1 10*3/uL (ref 0.0–0.2)
EOS (ABSOLUTE): 0.6 10*3/uL — ABNORMAL HIGH (ref 0.0–0.4)
EOS: 4 %
HEMATOCRIT: 46.7 % (ref 37.5–51.0)
Hemoglobin: 15.4 g/dL (ref 12.6–17.7)
IMMATURE GRANS (ABS): 0 10*3/uL (ref 0.0–0.1)
IMMATURE GRANULOCYTES: 0 %
LYMPHS: 25 %
Lymphocytes Absolute: 3.5 10*3/uL — ABNORMAL HIGH (ref 0.7–3.1)
MCH: 28.8 pg (ref 26.6–33.0)
MCHC: 33 g/dL (ref 31.5–35.7)
MCV: 87 fL (ref 79–97)
MONOS ABS: 0.9 10*3/uL (ref 0.1–0.9)
Monocytes: 6 %
NEUTROS PCT: 65 %
Neutrophils Absolute: 9.1 10*3/uL — ABNORMAL HIGH (ref 1.4–7.0)
Platelets: 292 10*3/uL (ref 150–379)
RBC: 5.35 x10E6/uL (ref 4.14–5.80)
RDW: 14.4 % (ref 12.3–15.4)
WBC: 14.1 10*3/uL — ABNORMAL HIGH (ref 3.4–10.8)

## 2015-05-14 ENCOUNTER — Encounter: Payer: Self-pay | Admitting: Family Medicine

## 2015-05-23 ENCOUNTER — Encounter (HOSPITAL_COMMUNITY): Payer: Self-pay

## 2015-05-23 ENCOUNTER — Emergency Department (HOSPITAL_COMMUNITY)
Admission: EM | Admit: 2015-05-23 | Discharge: 2015-05-23 | Disposition: A | Discharge status: Home / Self Care | Payer: BLUE CROSS/BLUE SHIELD | Attending: Emergency Medicine | Admitting: Emergency Medicine

## 2015-05-23 DIAGNOSIS — R1110 Vomiting, unspecified: Secondary | ICD-10-CM | POA: Insufficient documentation

## 2015-05-23 DIAGNOSIS — R51 Headache: Secondary | ICD-10-CM | POA: Diagnosis not present

## 2015-05-23 DIAGNOSIS — K0889 Other specified disorders of teeth and supporting structures: Secondary | ICD-10-CM | POA: Diagnosis present

## 2015-05-23 DIAGNOSIS — F17210 Nicotine dependence, cigarettes, uncomplicated: Secondary | ICD-10-CM | POA: Insufficient documentation

## 2015-05-23 DIAGNOSIS — K047 Periapical abscess without sinus: Secondary | ICD-10-CM

## 2015-05-23 DIAGNOSIS — K029 Dental caries, unspecified: Secondary | ICD-10-CM

## 2015-05-23 MED ORDER — CEFTRIAXONE SODIUM 1 G IJ SOLR
1.0000 g | Freq: Once | INTRAMUSCULAR | Status: AC
  Administered 2015-05-23: 1 g via INTRAMUSCULAR
  Filled 2015-05-23: qty 10

## 2015-05-23 MED ORDER — IBUPROFEN 800 MG PO TABS
800.0000 mg | ORAL_TABLET | Freq: Three times a day (TID) | ORAL | Status: DC
Start: 2015-05-23 — End: 2016-01-10

## 2015-05-23 MED ORDER — ONDANSETRON HCL 4 MG PO TABS
4.0000 mg | ORAL_TABLET | Freq: Once | ORAL | Status: AC
  Administered 2015-05-23: 4 mg via ORAL
  Filled 2015-05-23: qty 1

## 2015-05-23 MED ORDER — LIDOCAINE HCL (PF) 1 % IJ SOLN
INTRAMUSCULAR | Status: DC
Start: 2015-05-23 — End: 2015-05-24
  Filled 2015-05-23: qty 5

## 2015-05-23 MED ORDER — AMOXICILLIN 500 MG PO CAPS: 500.0000 mg | ORAL_CAPSULE | Freq: Three times a day (TID) | ORAL | Status: DC

## 2015-05-23 MED ORDER — IBUPROFEN 800 MG PO TABS
800.0000 mg | ORAL_TABLET | Freq: Once | ORAL | Status: AC
  Administered 2015-05-23: 800 mg via ORAL
  Filled 2015-05-23: qty 1

## 2015-05-23 MED ORDER — ACETAMINOPHEN 325 MG PO TABS
650.0000 mg | ORAL_TABLET | Freq: Once | ORAL | Status: AC
  Administered 2015-05-23: 650 mg via ORAL
  Filled 2015-05-23: qty 2

## 2015-05-23 NOTE — ED Notes (Signed)
Instructed pt to take all of antibiotics as prescribed. 

## 2015-05-23 NOTE — ED Provider Notes (Signed)
CSN: [id number]     Arrival date & time 05/23/15  2029 History   First MD Initiated Contact with Patient 05/23/15 2043     Chief Complaint  Patient presents with  . Dental Pain     (Consider location/radiation/quality/duration/timing/severity/associated sxs/prior Treatment) HPI Comments: Patient has had problems with dental caries for over a year. He states that the pain seems to come and go. This time the pain came on worse than usual. He states that he came to the emergency room because he is "just tired of hurting and dealing with it". His been no high fevers reported. The patient denies chest pain or shortness of breath. His been no loss of consciousness, or no altering of his mental status. No high fevers reported.  Patient is a 35 y.o. male presenting with tooth pain. The history is provided by the patient.  Dental Pain Location:  Lower Quality:  Aching and shooting Severity:  Moderate Onset quality:  Gradual Duration: Tooth pain for nearly a year, worse today. Timing:  Intermittent Progression:  Worsening Chronicity:  Chronic Context: dental caries, dental fracture and poor dentition   Relieved by:  Nothing Worsened by:  Nothing tried Associated symptoms: gum swelling, headaches and neck pain   Associated symptoms: no difficulty swallowing, no drooling, no neck swelling and no trismus   Risk factors: lack of dental care and smoking     Past Medical History  Diagnosis Date  . No pertinent past medical history    Past Surgical History  Procedure Laterality Date  . Appendectomy    . Abdominal surgery    . Cholecystectomy    . Pilonidal cyst excision     History reviewed. No pertinent family history. Social History  Substance Use Topics  . Smoking status: Current Every Day Smoker -- 1.00 packs/day    Types: Cigarettes  . Smokeless tobacco: None  . Alcohol Use: 0.6 oz/week    1 Cans of beer per week     Comment: a month    Review of Systems  HENT: Positive for  dental problem. Negative for drooling and trouble swallowing.   Musculoskeletal: Positive for neck pain.  Neurological: Positive for headaches.  All other systems reviewed and are negative.     Allergies  Tramadol  Home Medications   Prior to Admission medications   Not on File   BP 153/95 mmHg  Pulse 78  Temp(Src) 98.3 F (36.8 C) (Oral)  Resp 16  Ht  (1.753 m)  Wt 119.75 kg  BMI 38.97 kg/m2  SpO2 100% Physical Exam  Constitutional: He is oriented to person, place, and time. He appears well-developed and well-nourished.  Non-toxic appearance.  HENT:  Head: Normocephalic.  Right Ear: Tympanic membrane and external ear normal.  Left Ear: Tympanic membrane and external ear normal.  Multiple dental caries of the upper and lower gums. There is swelling of the lower jaw. There is a broken tooth with a portion of the cavity at the gum line. No visible abscess noted. The airway is patent. No swelling under the tongue.  Eyes: EOM and lids are normal. Pupils are equal, round, and reactive to light.  Neck: Normal range of motion. Neck supple. Carotid bruit is not present.  Cardiovascular: Normal rate, regular rhythm, normal heart sounds, intact distal pulses and normal pulses.   Pulmonary/Chest: Breath sounds normal. No respiratory distress.  Abdominal: Soft. Bowel sounds are normal. There is no tenderness. There is no guarding.  Musculoskeletal: Normal range of motion.  Lymphadenopathy:       Head (right side): No submandibular adenopathy present.       Head (left side): No submandibular adenopathy present.    He has no cervical adenopathy.  Neurological: He is alert and oriented to person, place, and time. He has normal strength. No cranial nerve deficit or sensory deficit.  Skin: Skin is warm and dry.  Psychiatric: He has a normal mood and affect. His speech is normal.  Nursing note and vitals reviewed.   ED Course  Procedures (including critical care time) Labs  Review Labs Reviewed - No data to display  Imaging Review No results found. I have personally reviewed and evaluated these images and lab results as part of my medical decision-making.   EKG Interpretation None      MDM Vital signs within normal limits. The patient has multiple dental caries of the upper and lower jaw. There is no evidence of Ludwig's Angina or related emergent events. The airway is patent, and the speech is understandable. There is no trismus.  Because this condition has been going on for an extended period of time, and now was causing him pain that radiates into the neck, the patient will be treated with intramuscular Rocephin. Prescription for Amoxil is also given. The patient will be treated with ibuprofen 800 mg, and asked to use Tylenol with the ibuprofen doses. The patient states that he now has insurance resources and can't get to see a dentist. I have strongly encouraged the patient to see a dentist systems possible.    Final diagnoses:  None    *I have reviewed nursing notes, vital signs, and all appropriate lab and imaging results for this patient.**    Ivery QualeHobson Koula Venier, PA-C 05/23/15 2111  Doug SouSam Jacubowitz, MD 05/23/15 [phone]

## 2015-05-23 NOTE — ED Notes (Signed)
H. Bryant, PA at bedside. 

## 2015-05-23 NOTE — Discharge Instructions (Signed)
It is important that you see a dentist as soon as possible for resolution of this dental issue. Please use Amoxil and ibuprofen 3 times daily with food. Please use Tylenol in between the Motrin doses if needed. The over-the-counter "artificial filling" may be helpful to keep cold air ankle foods off of the broken area of your tooth. Dental Caries Dental caries is tooth decay. This decay can cause a hole in teeth (cavity) that can get bigger and deeper over time. HOME CARE  Brush and floss your teeth. Do this at least two times a day.  Use a fluoride toothpaste.  Use a mouth rinse if told by your dentist or doctor.  Eat less sugary and starchy foods. Drink less sugary drinks.  Avoid snacking often on sugary and starchy foods. Avoid sipping often on sugary drinks.  Keep regular checkups and cleanings with your dentist.  Use fluoride supplements if told by your dentist or doctor.  Allow fluoride to be applied to teeth if told by your dentist or doctor.   This information is not intended to replace advice given to you by your health care provider. Make sure you discuss any questions you have with your health care provider.   Document Released: 10/13/2007 Document Revised: 01/24/2014 Document Reviewed: 01/06/2012 Elsevier Interactive Patient Education [employer]2016 [employer]

## 2015-05-23 NOTE — ED Notes (Signed)
Patient states that he started having a headache around 3 pm, then started having jaw pain and pain going down my left neck.  I took some medications at home and then I started vomiting. Have been dizzy over the past few weeks. Denies chest pain, shortness of breath.  Patient states that a metal shelf fell into his left jaw at work and broke off a tooth and he does not have the money to have it fixed. The tooth is still broken in his mouth and it hurts to even brush it.

## 2015-05-28 ENCOUNTER — Ambulatory Visit: Payer: BLUE CROSS/BLUE SHIELD | Admitting: Pharmacist

## 2015-05-29 ENCOUNTER — Encounter: Payer: Self-pay | Admitting: Family Medicine

## 2015-06-05 ENCOUNTER — Ambulatory Visit: Payer: BLUE CROSS/BLUE SHIELD | Admitting: Pharmacist

## 2015-06-09 ENCOUNTER — Encounter: Payer: Self-pay | Admitting: Family Medicine

## 2015-08-10 ENCOUNTER — Ambulatory Visit: Payer: BLUE CROSS/BLUE SHIELD | Admitting: Family Medicine

## 2015-08-11 ENCOUNTER — Encounter: Payer: Self-pay | Admitting: Family Medicine

## 2015-09-11 ENCOUNTER — Ambulatory Visit: Payer: BLUE CROSS/BLUE SHIELD | Admitting: Physician Assistant

## 2015-09-14 ENCOUNTER — Encounter: Payer: Self-pay | Admitting: Family Medicine

## 2016-01-10 ENCOUNTER — Emergency Department (HOSPITAL_COMMUNITY)
Admission: EM | Admit: 2016-01-10 | Discharge: 2016-01-10 | Disposition: A | Discharge status: Home / Self Care | Payer: BLUE CROSS/BLUE SHIELD | Attending: Emergency Medicine | Admitting: Emergency Medicine

## 2016-01-10 ENCOUNTER — Encounter (HOSPITAL_COMMUNITY): Payer: Self-pay | Admitting: *Deleted

## 2016-01-10 DIAGNOSIS — R112 Nausea with vomiting, unspecified: Secondary | ICD-10-CM | POA: Insufficient documentation

## 2016-01-10 DIAGNOSIS — R509 Fever, unspecified: Secondary | ICD-10-CM | POA: Insufficient documentation

## 2016-01-10 DIAGNOSIS — R197 Diarrhea, unspecified: Secondary | ICD-10-CM | POA: Insufficient documentation

## 2016-01-10 DIAGNOSIS — Z7984 Long term (current) use of oral hypoglycemic drugs: Secondary | ICD-10-CM | POA: Diagnosis not present

## 2016-01-10 DIAGNOSIS — F17210 Nicotine dependence, cigarettes, uncomplicated: Secondary | ICD-10-CM | POA: Insufficient documentation

## 2016-01-10 DIAGNOSIS — R109 Unspecified abdominal pain: Secondary | ICD-10-CM | POA: Diagnosis not present

## 2016-01-10 DIAGNOSIS — E119 Type 2 diabetes mellitus without complications: Secondary | ICD-10-CM | POA: Insufficient documentation

## 2016-01-10 HISTORY — DX: Type 2 diabetes mellitus without complications: E11.9

## 2016-01-10 LAB — COMPREHENSIVE METABOLIC PANEL
ALBUMIN: 3.9 g/dL (ref 3.5–5.0)
ALK PHOS: 70 U/L (ref 38–126)
ALT: 38 U/L (ref 17–63)
ANION GAP: 9 (ref 5–15)
AST: 25 U/L (ref 15–41)
BUN: 12 mg/dL (ref 6–20)
CALCIUM: 8.9 mg/dL (ref 8.9–10.3)
CHLORIDE: 105 mmol/L (ref 101–111)
CO2: 21 mmol/L — AB (ref 22–32)
CREATININE: 1.17 mg/dL (ref 0.61–1.24)
GFR calc Af Amer: >60 mL/min (ref >60)
GFR calc non Af Amer: >60 mL/min (ref >60)
GLUCOSE: 137 mg/dL — AB (ref 65–99)
Potassium: 3.9 mmol/L (ref 3.5–5.1)
SODIUM: 135 mmol/L (ref 135–145)
Total Bilirubin: 0.2 mg/dL — ABNORMAL LOW (ref 0.3–1.2)
Total Protein: 7.1 g/dL (ref 6.5–8.1)

## 2016-01-10 LAB — CBC WITH DIFFERENTIAL/PLATELET
Basophils Absolute: 0 10*3/uL (ref 0.0–0.1)
Basophils Relative: 0 %
EOS ABS: 0.2 10*3/uL (ref 0.0–0.7)
Eosinophils Relative: 1 %
HCT: 49.1 % (ref 39.0–52.0)
HEMOGLOBIN: 16.4 g/dL (ref 13.0–17.0)
LYMPHS ABS: 1.3 10*3/uL (ref 0.7–4.0)
LYMPHS PCT: 8 %
MCH: 29.5 pg (ref 26.0–34.0)
MCHC: 33.4 g/dL (ref 30.0–36.0)
MCV: 88.5 fL (ref 78.0–100.0)
MONOS PCT: 9 %
Monocytes Absolute: 1.4 10*3/uL — ABNORMAL HIGH (ref 0.1–1.0)
NEUTROS PCT: 82 %
Neutro Abs: 13.4 10*3/uL — ABNORMAL HIGH (ref 1.7–7.7)
Platelets: 242 10*3/uL (ref 150–400)
RBC: 5.55 MIL/uL (ref 4.22–5.81)
RDW: 14 % (ref 11.5–15.5)
WBC: 16.3 10*3/uL — ABNORMAL HIGH (ref 4.0–10.5)

## 2016-01-10 LAB — LIPASE, BLOOD: Lipase: 27 U/L (ref 11–51)

## 2016-01-10 MED ORDER — ONDANSETRON HCL 4 MG/2ML IJ SOLN
4.0000 mg | Freq: Once | INTRAMUSCULAR | Status: AC
  Administered 2016-01-10: 4 mg via INTRAVENOUS
  Filled 2016-01-10: qty 2

## 2016-01-10 MED ORDER — SODIUM CHLORIDE 0.9 % IV BOLUS (SEPSIS)
1000.0000 mL | Freq: Once | INTRAVENOUS | Status: AC
  Administered 2016-01-10: 1000 mL via INTRAVENOUS

## 2016-01-10 MED ORDER — ONDANSETRON HCL 4 MG PO TABS: 4.0000 mg | ORAL_TABLET | Freq: Four times a day (QID) | ORAL | 0 refills | Status: DC

## 2016-01-10 NOTE — ED Notes (Signed)
Pt expressed understanding of discharge instructions,  

## 2016-01-10 NOTE — ED Notes (Signed)
Pt has been able to drink ginger ale and he has been requesting more per casey vick rn

## 2016-01-10 NOTE — Discharge Instructions (Signed)

## 2016-01-10 NOTE — ED Provider Notes (Signed)
Emergency Department Provider Note  By signing my name below, I, Vista Minkobert Ross, attest that this documentation has been prepared under the direction and in the presence of No att. providers found. Electronically signed, Vista Minkobert Ross, ED Scribe. 01/10/16. 9:49 PM.  I have reviewed the triage vital signs and the nursing notes.   HISTORY  Chief Complaint Diarrhea   HPI HPI Comments: Roy [name] is a 35 y.o. male, with Hx of DM, who presents to the Emergency Department complaining of gradual onset, nausea, vomiting and diarrhea that started this morning. He also reports intermittent abdominal cramping during vomiting episodes. Pt also reports a subjective fever at home, pt's temperature is 100.1 on arrival. Pt states that he started taking Metformin 2 months ago. No OTC medications taken prior to arrival. He has been able to tolerate foods but states that drinking liquids help the burn in his throat after vomiting. No urinary symptoms. No vomiting and diarrhea.   Past Medical History:  Diagnosis Date  . Diabetes mellitus without complication (HCC)   . No pertinent past medical history     Patient Active Problem List   Diagnosis Date Noted  . Hydradenitis 05/08/2015  . Obesity 05/08/2015  . Acute renal failure (HCC) 07/28/2011  . Tobacco abuse 07/26/2011    Past Surgical History:  Procedure Laterality Date  . ABDOMINAL SURGERY    . APPENDECTOMY    . CHOLECYSTECTOMY    . PILONIDAL CYST EXCISION      Current Outpatient Rx  . Order #: [id number] Class: Historical Med  . Order #: [id number] Class: Print    Allergies Tramadol  No family history on file.  Social History Social History  Substance Use Topics  . Smoking status: Current Every Day Smoker    Packs/day: 1.00    Types: Cigarettes  . Smokeless tobacco: Never Used  . Alcohol use 0.6 oz/week    1 Cans of beer per week     Comment: a month    Review of Systems Constitutional: + fever/chills Eyes: No visual  changes. ENT: No sore throat. Cardiovascular: Denies chest pain. Respiratory: Denies shortness of breath. Gastrointestinal: + abdominal pain.  + nausea, + vomiting.  + diarrhea.  No constipation. Genitourinary: Negative for dysuria. Musculoskeletal: Negative for back pain. Skin: Negative for rash. Neurological: Negative for headaches, focal weakness or numbness.  10-point ROS otherwise negative.  ____________________________________________   PHYSICAL EXAM:  VITAL SIGNS: ED Triage Vitals  Enc Vitals Group     BP 01/10/16 2111 141/92     Pulse Rate 01/10/16 2111 113     Resp 01/10/16 2111 20     Temp 01/10/16 2111 100.1 F (37.8 C)     Temp Source 01/10/16 2111 Oral     SpO2 01/10/16 2111 97 %     Pain Score 01/10/16 2108 4   Constitutional: Alert and oriented. Well appearing and in no acute distress. Eyes: Conjunctivae are normal.  Head: Atraumatic. Nose: No congestion/rhinnorhea. Mouth/Throat: Mucous membranes are moist.  Oropharynx non-erythematous. Neck: No stridor.   Cardiovascular: Normal rate, regular rhythm. Good peripheral circulation. Grossly normal heart sounds.   Respiratory: Normal respiratory effort.  No retractions. Lungs CTAB. Gastrointestinal: Soft and nontender. No distention.  Musculoskeletal: No lower extremity tenderness nor edema. No gross deformities of extremities. Neurologic:  Normal speech and language. No gross focal neurologic deficits are appreciated.  Skin:  Skin is warm, dry and intact. No rash noted.  ____________________________________________   LABS (all labs ordered are listed, but only abnormal  results are displayed)  Labs Reviewed  COMPREHENSIVE METABOLIC PANEL - Abnormal; Notable for the following:       Result Value   CO2 21 (*)    Glucose, Bld 137 (*)    Total Bilirubin 0.2 (*)    All other components within normal limits  CBC WITH DIFFERENTIAL/PLATELET - Abnormal; Notable for the following:    WBC 16.3 (*)    Neutro Abs  13.4 (*)    Monocytes Absolute 1.4 (*)    All other components within normal limits  LIPASE, BLOOD   ____________________________________________   PROCEDURES  Procedure(s) performed:   Procedures  None ____________________________________________   INITIAL IMPRESSION / ASSESSMENT AND PLAN / ED COURSE  Pertinent labs & imaging results that were available during my care of the patient were reviewed by me and considered in my medical decision making (see chart for details).  DIAGNOSTIC STUDIES: Oxygen Saturation is 97% on RA, normal by my interpretation.  COORDINATION OF CARE: 8:49 AM-Discussed treatment plan with pt at bedside and pt agreed to plan.   Patient presents to the ED with vomiting and diarrhea. Abdomen is soft and completely non-tender. Labs are significant for leukocytosis. Clinical picture is most consistent with viral process. With no abdominal tenderness I will defer advanced imaging of the abdomen at this time. Patient feeling better after Zofran and IVF. Tolerating PO easily at discharge. Plan for PCP in the coming week PRN.   At this time, I do not feel there is any life-threatening condition present. I have reviewed and discussed all results (EKG, imaging, lab, urine as appropriate), exam findings with patient. I have reviewed nursing notes and appropriate previous records.  I feel the patient is safe to be discharged home without further emergent workup. Discussed usual and customary return precautions. Patient and family (if present) verbalize understanding and are comfortable with this plan.  Patient will follow-up with their primary care provider. If they do not have a primary care provider, information for follow-up has been provided to them. All questions have been answered.  __________________________________________  FINAL CLINICAL IMPRESSION(S) / ED DIAGNOSES  Final diagnoses:  Diarrhea, unspecified type     MEDICATIONS GIVEN DURING THIS  VISIT:  Medications  sodium chloride 0.9 % bolus 1,000 mL (0 mLs Intravenous Stopped 01/10/16 2319)  ondansetron (ZOFRAN) injection 4 mg (4 mg Intravenous Given 01/10/16 2220)     NEW OUTPATIENT MEDICATIONS STARTED DURING THIS VISIT:  Discharge Medication List as of 01/10/2016 11:10 PM    START taking these medications   Details  ondansetron (ZOFRAN) 4 MG tablet Take 1 tablet (4 mg total) by mouth every 6 (six) hours., Starting Sun 01/10/2016, Print        I personally performed the services described in this documentation, which was scribed in my presence. The recorded information has been reviewed and is accurate.    Note:  This document was prepared using Dragon voice recognition software and may include unintentional dictation errors.  Alona BeneJoshua Freda Jaquith, MD Emergency Medicine    Maia PlanJoshua G Farris Blash, MD 01/12/16 [phone]

## 2016-01-10 NOTE — ED Triage Notes (Signed)
Pt c/o n/v/d, chills, abd cramping that started this am,

## 2018-01-19 DIAGNOSIS — R079 Chest pain, unspecified: Secondary | ICD-10-CM | POA: Diagnosis not present

## 2018-01-19 DIAGNOSIS — I161 Hypertensive emergency: Secondary | ICD-10-CM | POA: Diagnosis not present

## 2018-01-19 DIAGNOSIS — R0602 Shortness of breath: Secondary | ICD-10-CM | POA: Diagnosis not present

## 2018-01-20 ENCOUNTER — Other Ambulatory Visit: Payer: Self-pay

## 2018-01-20 ENCOUNTER — Observation Stay (HOSPITAL_COMMUNITY)
Admission: EM | Admit: 2018-01-20 | Discharge: 2018-01-20 | Disposition: A | Discharge status: Home / Self Care | Payer: BLUE CROSS/BLUE SHIELD | Attending: Nephrology | Admitting: Nephrology

## 2018-01-20 ENCOUNTER — Emergency Department (HOSPITAL_COMMUNITY): Payer: BLUE CROSS/BLUE SHIELD

## 2018-01-20 ENCOUNTER — Observation Stay (HOSPITAL_COMMUNITY): Payer: BLUE CROSS/BLUE SHIELD

## 2018-01-20 ENCOUNTER — Observation Stay (HOSPITAL_BASED_OUTPATIENT_CLINIC_OR_DEPARTMENT_OTHER): Payer: BLUE CROSS/BLUE SHIELD

## 2018-01-20 ENCOUNTER — Encounter (HOSPITAL_COMMUNITY): Payer: Self-pay | Admitting: Emergency Medicine

## 2018-01-20 DIAGNOSIS — Z885 Allergy status to narcotic agent status: Secondary | ICD-10-CM | POA: Insufficient documentation

## 2018-01-20 DIAGNOSIS — R0602 Shortness of breath: Secondary | ICD-10-CM | POA: Diagnosis not present

## 2018-01-20 DIAGNOSIS — R0789 Other chest pain: Secondary | ICD-10-CM | POA: Diagnosis not present

## 2018-01-20 DIAGNOSIS — Z7984 Long term (current) use of oral hypoglycemic drugs: Secondary | ICD-10-CM | POA: Diagnosis not present

## 2018-01-20 DIAGNOSIS — R590 Localized enlarged lymph nodes: Secondary | ICD-10-CM | POA: Insufficient documentation

## 2018-01-20 DIAGNOSIS — Z8249 Family history of ischemic heart disease and other diseases of the circulatory system: Secondary | ICD-10-CM | POA: Diagnosis not present

## 2018-01-20 DIAGNOSIS — R918 Other nonspecific abnormal finding of lung field: Secondary | ICD-10-CM | POA: Diagnosis not present

## 2018-01-20 DIAGNOSIS — I10 Essential (primary) hypertension: Secondary | ICD-10-CM | POA: Diagnosis present

## 2018-01-20 DIAGNOSIS — Z6841 Body Mass Index (BMI) 40.0 and over, adult: Secondary | ICD-10-CM | POA: Insufficient documentation

## 2018-01-20 DIAGNOSIS — Z888 Allergy status to other drugs, medicaments and biological substances status: Secondary | ICD-10-CM | POA: Diagnosis not present

## 2018-01-20 DIAGNOSIS — Z720 Tobacco use: Secondary | ICD-10-CM | POA: Diagnosis present

## 2018-01-20 DIAGNOSIS — Z9049 Acquired absence of other specified parts of digestive tract: Secondary | ICD-10-CM | POA: Diagnosis not present

## 2018-01-20 DIAGNOSIS — D72829 Elevated white blood cell count, unspecified: Secondary | ICD-10-CM | POA: Diagnosis not present

## 2018-01-20 DIAGNOSIS — R079 Chest pain, unspecified: Secondary | ICD-10-CM | POA: Diagnosis present

## 2018-01-20 DIAGNOSIS — I161 Hypertensive emergency: Secondary | ICD-10-CM | POA: Diagnosis not present

## 2018-01-20 DIAGNOSIS — Z833 Family history of diabetes mellitus: Secondary | ICD-10-CM | POA: Diagnosis not present

## 2018-01-20 DIAGNOSIS — E119 Type 2 diabetes mellitus without complications: Secondary | ICD-10-CM

## 2018-01-20 DIAGNOSIS — R000 Tachycardia, unspecified: Secondary | ICD-10-CM | POA: Diagnosis not present

## 2018-01-20 DIAGNOSIS — I160 Hypertensive urgency: Secondary | ICD-10-CM

## 2018-01-20 DIAGNOSIS — R7989 Other specified abnormal findings of blood chemistry: Secondary | ICD-10-CM | POA: Diagnosis not present

## 2018-01-20 DIAGNOSIS — E66813 Obesity, class 3: Secondary | ICD-10-CM | POA: Diagnosis present

## 2018-01-20 DIAGNOSIS — I119 Hypertensive heart disease without heart failure: Secondary | ICD-10-CM | POA: Insufficient documentation

## 2018-01-20 DIAGNOSIS — F17210 Nicotine dependence, cigarettes, uncomplicated: Secondary | ICD-10-CM | POA: Diagnosis not present

## 2018-01-20 HISTORY — DX: Elevated white blood cell count, unspecified: D72.829

## 2018-01-20 HISTORY — DX: Morbid (severe) obesity due to excess calories: E66.01

## 2018-01-20 HISTORY — DX: Essential (primary) hypertension: I10

## 2018-01-20 HISTORY — DX: Tobacco use: Z72.0

## 2018-01-20 LAB — HEPATIC FUNCTION PANEL
ALK PHOS: 100 U/L (ref 38–126)
ALT: 55 U/L — ABNORMAL HIGH (ref 0–44)
AST: 34 U/L (ref 15–41)
Albumin: 4.1 g/dL (ref 3.5–5.0)
Bilirubin, Direct: <0.1 mg/dL (ref 0.0–0.2)
Total Bilirubin: 0.5 mg/dL (ref 0.3–1.2)
Total Protein: 7.6 g/dL (ref 6.5–8.1)

## 2018-01-20 LAB — CBC WITH DIFFERENTIAL/PLATELET
Abs Immature Granulocytes: 0.07 10*3/uL (ref 0.00–0.07)
Basophils Absolute: 0.1 10*3/uL (ref 0.0–0.1)
Basophils Relative: 0 %
EOS PCT: 4 %
Eosinophils Absolute: 0.6 10*3/uL — ABNORMAL HIGH (ref 0.0–0.5)
HCT: 47.1 % (ref 39.0–52.0)
Hemoglobin: 14.9 g/dL (ref 13.0–17.0)
Immature Granulocytes: 1 %
Lymphocytes Relative: 20 %
Lymphs Abs: 3 10*3/uL (ref 0.7–4.0)
MCH: 27.9 pg (ref 26.0–34.0)
MCHC: 31.6 g/dL (ref 30.0–36.0)
MCV: 88 fL (ref 80.0–100.0)
Monocytes Absolute: 0.9 10*3/uL (ref 0.1–1.0)
Monocytes Relative: 6 %
Neutro Abs: 10.4 10*3/uL — ABNORMAL HIGH (ref 1.7–7.7)
Neutrophils Relative %: 69 %
Platelets: 271 10*3/uL (ref 150–400)
RBC: 5.35 MIL/uL (ref 4.22–5.81)
RDW: 14 % (ref 11.5–15.5)
WBC: 15 10*3/uL — ABNORMAL HIGH (ref 4.0–10.5)
nRBC: 0 % (ref 0.0–0.2)

## 2018-01-20 LAB — CBC
HCT: 48.7 % (ref 39.0–52.0)
Hemoglobin: 15.5 g/dL (ref 13.0–17.0)
MCH: 28.1 pg (ref 26.0–34.0)
MCHC: 31.8 g/dL (ref 30.0–36.0)
MCV: 88.2 fL (ref 80.0–100.0)
Platelets: 323 10*3/uL (ref 150–400)
RBC: 5.52 MIL/uL (ref 4.22–5.81)
RDW: 13.8 % (ref 11.5–15.5)
WBC: 21.1 10*3/uL — AB (ref 4.0–10.5)
nRBC: 0 % (ref 0.0–0.2)

## 2018-01-20 LAB — BASIC METABOLIC PANEL
Anion gap: 9 (ref 5–15)
BUN: 11 mg/dL (ref 6–20)
CO2: 25 mmol/L (ref 22–32)
CREATININE: 1.08 mg/dL (ref 0.61–1.24)
Calcium: 9.2 mg/dL (ref 8.9–10.3)
Chloride: 100 mmol/L (ref 98–111)
GLUCOSE: 189 mg/dL — AB (ref 70–99)
Potassium: 3.5 mmol/L (ref 3.5–5.1)
Sodium: 134 mmol/L — ABNORMAL LOW (ref 135–145)

## 2018-01-20 LAB — LIPID PANEL
Cholesterol: 161 mg/dL (ref 0–200)
HDL: 34 mg/dL — ABNORMAL LOW (ref >40)
LDL Cholesterol: 99 mg/dL (ref 0–99)
Total CHOL/HDL Ratio: 4.7 RATIO
Triglycerides: 138 mg/dL (ref <150)
VLDL: 28 mg/dL (ref 0–40)

## 2018-01-20 LAB — ECHOCARDIOGRAM COMPLETE
Height: 71 in
Weight: 4800 oz

## 2018-01-20 LAB — MAGNESIUM: Magnesium: 2.2 mg/dL (ref 1.7–2.4)

## 2018-01-20 LAB — D-DIMER, QUANTITATIVE: D-Dimer, Quant: 0.65 ug/mL-FEU — ABNORMAL HIGH (ref 0.00–0.50)

## 2018-01-20 LAB — CBG MONITORING, ED
Glucose-Capillary: 165 mg/dL — ABNORMAL HIGH (ref 70–99)
Glucose-Capillary: 180 mg/dL — ABNORMAL HIGH (ref 70–99)

## 2018-01-20 LAB — TROPONIN I
Troponin I: <0.03 ng/mL (ref <0.03)
Troponin I: <0.03 ng/mL (ref <0.03)
Troponin I: <0.03 ng/mL (ref <0.03)

## 2018-01-20 LAB — LIPASE, BLOOD: Lipase: 36 U/L (ref 11–51)

## 2018-01-20 LAB — HEMOGLOBIN A1C
HEMOGLOBIN A1C: 8.3 % — AB (ref 4.8–5.6)
Mean Plasma Glucose: 191.51 mg/dL

## 2018-01-20 LAB — PHOSPHORUS: Phosphorus: 3.5 mg/dL (ref 2.5–4.6)

## 2018-01-20 MED ORDER — ENOXAPARIN SODIUM 40 MG/0.4ML ~~LOC~~ SOLN: 40.0000 mg | SUBCUTANEOUS | Status: DC

## 2018-01-20 MED ORDER — LISINOPRIL 5 MG PO TABS
5.0000 mg | ORAL_TABLET | Freq: Every day | ORAL | Status: DC
  Administered 2018-01-20: 5 mg via ORAL
  Filled 2018-01-20: qty 1

## 2018-01-20 MED ORDER — ENOXAPARIN SODIUM 80 MG/0.8ML ~~LOC~~ SOLN
0.5000 mg/kg | SUBCUTANEOUS | Status: DC
  Administered 2018-01-20: 70 mg via SUBCUTANEOUS
  Filled 2018-01-20: qty 0.8

## 2018-01-20 MED ORDER — IOPAMIDOL (ISOVUE-370) INJECTION 76%
100.0000 mL | Freq: Once | INTRAVENOUS | Status: AC | PRN
  Administered 2018-01-20: 100 mL via INTRAVENOUS

## 2018-01-20 MED ORDER — LABETALOL HCL 5 MG/ML IV SOLN
10.0000 mg | Freq: Once | INTRAVENOUS | Status: AC
  Administered 2018-01-20: 10 mg via INTRAVENOUS
  Filled 2018-01-20: qty 4

## 2018-01-20 MED ORDER — ASPIRIN 81 MG PO CHEW
324.0000 mg | CHEWABLE_TABLET | Freq: Once | ORAL | Status: AC
  Administered 2018-01-20: 324 mg via ORAL
  Filled 2018-01-20: qty 4

## 2018-01-20 MED ORDER — TECHNETIUM TC 99M DIETHYLENETRIAME-PENTAACETIC ACID
30.0000 | Freq: Once | INTRAVENOUS | Status: AC | PRN
  Administered 2018-01-20: 31 via RESPIRATORY_TRACT

## 2018-01-20 MED ORDER — ONDANSETRON HCL 4 MG/2ML IJ SOLN: 4.0000 mg | Freq: Four times a day (QID) | INTRAMUSCULAR | Status: DC | PRN

## 2018-01-20 MED ORDER — ACETAMINOPHEN 650 MG RE SUPP: 650.0000 mg | Freq: Four times a day (QID) | RECTAL | Status: DC | PRN

## 2018-01-20 MED ORDER — TECHNETIUM TO 99M ALBUMIN AGGREGATED
4.0000 | Freq: Once | INTRAVENOUS | Status: AC | PRN
  Administered 2018-01-20: 4.4 via INTRAVENOUS

## 2018-01-20 MED ORDER — POTASSIUM CHLORIDE CRYS ER 20 MEQ PO TBCR
40.0000 meq | EXTENDED_RELEASE_TABLET | Freq: Once | ORAL | Status: AC
  Administered 2018-01-20: 40 meq via ORAL
  Filled 2018-01-20: qty 2

## 2018-01-20 MED ORDER — NICOTINE 21 MG/24HR TD PT24: 21.0000 mg | MEDICATED_PATCH | Freq: Every day | TRANSDERMAL | Status: DC | PRN

## 2018-01-20 MED ORDER — ACETAMINOPHEN 325 MG PO TABS: 650.0000 mg | ORAL_TABLET | Freq: Four times a day (QID) | ORAL | Status: DC | PRN

## 2018-01-20 MED ORDER — LABETALOL HCL 5 MG/ML IV SOLN: 10.0000 mg | INTRAVENOUS | Status: DC | PRN

## 2018-01-20 MED ORDER — LISINOPRIL 5 MG PO TABS: 5.0000 mg | ORAL_TABLET | Freq: Every day | ORAL | 2 refills | Status: AC

## 2018-01-20 MED ORDER — NITROGLYCERIN 0.4 MG SL SUBL
0.4000 mg | SUBLINGUAL_TABLET | SUBLINGUAL | Status: DC | PRN
  Administered 2018-01-20: 0.4 mg via SUBLINGUAL
  Filled 2018-01-20: qty 1

## 2018-01-20 NOTE — ED Triage Notes (Signed)
Pt c/o left intermittent chest pain x 2 weeks, worsening tonight and started radiating down left arm x 3 hrs ago

## 2018-01-20 NOTE — ED Notes (Addendum)
Family at bedside. Requesting to speak with physician. Advised family is still waiting on admission bed. Paged Dr Arlean Hopping, states he will come in to speak with family. Patient lying in bed sleeping at this time.

## 2018-01-20 NOTE — ED Provider Notes (Signed)
[facility] EMERGENCY DEPARTMENT Provider Note   CSN: [id number] Arrival date & time: 01/20/18  0002     History   Chief Complaint Chief Complaint  Patient presents with  . Chest Pain    HPI Roy [name] is a 38 y.o. male.  Patient with history of morbid obesity, tobacco abuse, possibly hypertension and diabetes presenting with chest pain.  States he has had pain in the center of his chest for the past 2 weeks it has been coming and going.  It feels like a pressure and "pinching" in the center of his chest and lasts about 30 minutes at a time.  Nothing in particular brings it on.  Today he has had an episode of pain for about the past 3 hours but now is going to his left arm which concerned him.  It is never gone to his arm in the past.  Denies any back pain or abdominal pain.  States the pain is not necessarily exertional and sometimes comes on with rest.  Denies any history of heart problems.  Reports his father and brother both had heart attacks in their 30s.  Patient does not have a doctor.  He smokes cigarettes but denies any cocaine abuse.  No leg pain or leg swelling.  No abdominal pain or back pain.  No nausea, vomiting, diaphoresis or syncope.  States he was told that his job that he could be a prediabetic because his A1c was elevated.  He has never had high blood pressure in the past.  The history is provided by the patient.  Chest Pain   Associated symptoms include shortness of breath. Pertinent negatives include no abdominal pain, no back pain, no dizziness, no fever, no nausea and no vomiting.    Past Medical History:  Diagnosis Date  . Diabetes mellitus without complication (HCC)   . No pertinent past medical history     Patient Active Problem List   Diagnosis Date Noted  . Hydradenitis 05/08/2015  . Obesity 05/08/2015  . Acute renal failure (HCC) 07/28/2011  . Tobacco abuse 07/26/2011    Past Surgical History:  Procedure Laterality Date  . ABDOMINAL SURGERY      . APPENDECTOMY    . CHOLECYSTECTOMY    . PILONIDAL CYST EXCISION          Home Medications    Prior to Admission medications   Medication Sig Start Date End Date Taking? Authorizing Provider  metFORMIN (GLUCOPHAGE) 500 MG tablet Take 500 mg by mouth 2 (two) times daily with a meal.    [provider]  ondansetron (ZOFRAN) 4 MG tablet Take 1 tablet (4 mg total) by mouth every 6 (six) hours. 01/10/16   Long, Arlyss Repress, MD    Family History History reviewed. No pertinent family history.  Social History Social History   Tobacco Use  . Smoking status: Current Every Day Smoker    Packs/day: 1.00    Types: Cigarettes  . Smokeless tobacco: Never Used  Substance Use Topics  . Alcohol use: Yes    Alcohol/week: 1.0 standard drinks    Types: 1 Cans of beer per week    Comment: a month  . Drug use: No     Allergies   Tramadol   Review of Systems Review of Systems  Constitutional: Negative for activity change and fever.  HENT: Negative for congestion and rhinorrhea.   Eyes: Negative for visual disturbance.  Respiratory: Positive for chest tightness and shortness of breath.   Cardiovascular:  Positive for chest pain.  Gastrointestinal: Negative for abdominal pain, nausea and vomiting.  Genitourinary: Negative for dysuria and hematuria.  Musculoskeletal: Negative for back pain.  Neurological: Negative for dizziness.    all other systems are negative except as noted in the HPI and PMH.   Physical Exam Updated Vital Signs BP (!) 193/118   Pulse (!) 123   Resp 18   Ht 5\' 11"  (1.803 m)   Wt 136.1 kg   SpO2 97%   BMI 41.84 kg/m   Physical Exam Vitals signs and nursing note reviewed.  Constitutional:      General: He is not in acute distress.    Appearance: He is well-developed. He is obese.  HENT:     Head: Normocephalic and atraumatic.     Mouth/Throat:     Pharynx: No oropharyngeal exudate.  Eyes:     Conjunctiva/sclera: Conjunctivae normal.      Pupils: Pupils are equal, round, and reactive to light.  Neck:     Musculoskeletal: Normal range of motion and neck supple.     Comments: No meningismus. Cardiovascular:     Rate and Rhythm: Regular rhythm. Tachycardia present.     Heart sounds: Normal heart sounds. No murmur.     Comments: Tachycardic 120s  Equal radial pulses and grip strength Pulmonary:     Effort: Pulmonary effort is normal. No respiratory distress.     Breath sounds: Normal breath sounds.  Abdominal:     Palpations: Abdomen is soft.     Tenderness: There is no abdominal tenderness. There is no guarding or rebound.  Musculoskeletal: Normal range of motion.        General: No tenderness.  Skin:    General: Skin is warm.  Neurological:     Mental Status: He is alert and oriented to person, place, and time.     Cranial Nerves: No cranial nerve deficit.     Motor: No abnormal muscle tone.     Coordination: Coordination normal.     Comments: No ataxia on finger to nose bilaterally. No pronator drift. 5/5 strength throughout. CN 2-12 intact.Equal grip strength. Sensation intact.   Psychiatric:        Behavior: Behavior normal.      ED Treatments / Results  Labs (all labs ordered are listed, but only abnormal results are displayed) Labs Reviewed  BASIC METABOLIC PANEL - Abnormal; Notable for the following components:      Result Value   Sodium 134 (*)    Glucose, Bld 189 (*)    All other components within normal limits  CBC - Abnormal; Notable for the following components:   WBC 21.1 (*)    All other components within normal limits  HEPATIC FUNCTION PANEL - Abnormal; Notable for the following components:   ALT 55 (*)    All other components within normal limits  D-DIMER, QUANTITATIVE (NOT AT [facility]) - Abnormal; Notable for the following components:   D-Dimer, Quant 0.65 (*)    All other components within normal limits  LIPASE, BLOOD  TROPONIN I    EKG EKG Interpretation  Date/Time:  Saturday  January 20 2018 00:10:59 EST Ventricular Rate:  120 PR Interval:    QRS Duration: 96 QT Interval:  325 QTC Calculation: 460 R Axis:   -13 Text Interpretation:  Sinus tachycardia Probable left atrial enlargement Low voltage, precordial leads Baseline wander in lead(s) II III aVF No previous ECGs available Confirmed by Glynn Octaveancour, Elfriede Bonini [phone]([id number]) on 01/20/2018 12:12:39 AM  Radiology Dg Chest 2 View  Result Date: 01/20/2018 CLINICAL DATA:  Intermittent left chest pain for 2 weeks. EXAM: CHEST - 2 VIEW COMPARISON:  Radiographs 04/02/2009 FINDINGS: The cardiomediastinal contours are normal. Mild bronchitic change. Pulmonary vasculature is normal. No consolidation, pleural effusion, or pneumothorax. No acute osseous abnormalities are seen. IMPRESSION: Mild bronchitic change which may be related smoking or bronchitis. Electronically Signed   By: Narda Rutherford M.D.   On: 01/20/2018 01:11   Ct Angio Chest Pe W And/or Wo Contrast  Result Date: 01/20/2018 CLINICAL DATA:  Chest pain for 2 weeks, worsened tonight now radiating down left arm. PE suspected, intermediate prob, positive D-dimer EXAM: CT ANGIOGRAPHY CHEST WITH CONTRAST TECHNIQUE: Multidetector CT imaging of the chest was performed using the standard protocol during bolus administration of intravenous contrast. Multiplanar CT image reconstructions and MIPs were obtained to evaluate the vascular anatomy. Contrast bolus was suboptimal for pulmonary artery evaluation. Repeat injection was offered, however referring clinician declined at this time. CONTRAST:  ISOVUE-370 IOPAMIDOL (ISOVUE-370) INJECTION 76% COMPARISON:  Radiographs earlier this day. FINDINGS: Cardiovascular: Evaluation is nondiagnostic for evaluation of pulmonary embolus due to contrast bolus timing. The thoracic aorta is normal in caliber without dissection or aneurysm. Heart is normal in size. No pericardial effusion. Mediastinum/Nodes: Multiple small highest mediastinal bilateral  axillary nodes are not enlarged by size criteria. No hilar adenopathy. Esophagus is nondistended. No visualized thyroid nodule. Lungs/Pleura: Moderate central bronchial thickening. No focal consolidation. 7 mm pleural-based nodule in the left lower lobe, image 97 series 9. Additional pleural based nodularity more inferiorly in the left lower lobe as well as anteriorly in the right middle lobe. No pulmonary edema or pleural fluid. Upper Abdomen: Advanced hepatic steatosis.  Postcholecystectomy. Musculoskeletal: There are no acute or suspicious osseous abnormalities. Review of the MIP images confirms the above findings. IMPRESSION: 1. Nondiagnostic evaluation of the pulmonary arteries for pulmonary embolus due to contrast bolus timing. Consider repeat exam versus V/Q scan if there is persistent clinical concern for pulmonary embolus. 2. Normal thoracic aorta without dissection. 3. Central bronchial thickening, may be smoking related or secondary to bronchitis. 4. Prominent highest mediastinal and bilateral axillary lymph nodes, likely reactive and do not meet size criteria for further evaluation. 5. Pleural-based nodularity in the left lower lobe and right middle lobe, suggesting post infectious or post inflammatory scarring. Given smoking history, follow-up CT could be considered in 1 year. Electronically Signed   By: Narda Rutherford M.D.   On: 01/20/2018 02:10    Procedures Procedures (including critical care time)  Medications Ordered in ED Medications  aspirin chewable tablet 324 mg (has no administration in time range)  nitroGLYCERIN (NITROSTAT) SL tablet 0.4 mg (has no administration in time range)     Initial Impression / Assessment and Plan / ED Course  I have reviewed the triage vital signs and the nursing notes.  Pertinent labs & imaging results that were available during my care of the patient were reviewed by me and considered in my medical decision making (see chart for details).    2  weeks of intermittent central chest pain ongoing to the left arm.  EKG is sinus rhythm.  Tachycardic to the 120s and hypertensive.  Bilateral blood pressures appear equal.  Leukocytosis appears to be chronic.  D-dimer elevated at 0.65  Troponin negative.  Patient's pain is resolved after nitroglycerin and aspirin.  His heart score is 4.  Low suspicion for aortic dissection or pulmonary embolism.  CT scan is nondiagnostic  for PE per radiology. No aortic dissection seen.  Will order VQ scan for morning.  Chest pain is somewhat concerning for ACS.  Patient has a history of early cardiac disease in his family, diabetes, hypertension and is obese and a smoker. Remains chest pain-free after 1 nitroglycerin.  Plan admission for chest pain rule out and hypertensive urgency. D/w Dr. Robb Matarrtiz.  CRITICAL CARE Performed by: Glynn OctaveStephen Ajane Novella Total critical care time: 35 minutes Critical care time was exclusive of separately billable procedures and treating other patients. Critical care was necessary to treat or prevent imminent or life-threatening deterioration. Critical care was time spent personally by me on the following activities: development of treatment plan with patient and/or surrogate as well as nursing, discussions with consultants, evaluation of patient's response to treatment, examination of patient, obtaining history from patient or surrogate, ordering and performing treatments and interventions, ordering and review of laboratory studies, ordering and review of radiographic studies, pulse oximetry and re-evaluation of patient's condition.   Final Clinical Impressions(s) / ED Diagnoses   Final diagnoses:  Nonspecific chest pain  Hypertensive urgency    ED Discharge Orders    None       Makari Sanko, Jeannett SeniorStephen, MD 01/20/18 [phone]

## 2018-01-20 NOTE — ED Notes (Signed)
Patient transported to CT 

## 2018-01-20 NOTE — H&P (Addendum)
History and Physical    Roy [name] Roy [name] DOB: 29-May-1980 DOA: 01/20/2018  PCP: Patient, No Pcp Per   Patient coming from: Home.  I have personally briefly reviewed patient's old medical records in [facility]  Chief Complaint: Chest pain.  HPI: Roy [name] is a 38 y.o. male with medical history significant of type 2 diabetes, morbid obesity, tobacco use, hypertension, leukocytosis who is coming to the emergency department with complaints of intermittent chest pain, pressure-like, radiated to his left arm, associated with mild dyspnea, that lasts 20 to 30 minutes at a time.  He denies dizziness, nausea, emesis, diaphoresis, PND, orthopnea or recent pitting edema of the lower extremities.  He denies headache, fever, chills, sore throat, wheezing, hemoptysis, abdominal pain, diarrhea, constipation, melena or hematochezia.  No dysuria, frequency or hematuria.  No heat or cold intolerance.  No polyuria, polydipsia, polyphagia or blurred vision.  Denies skin rashes or pruritus.  ED Course: Initial vital signs temperature 98 F, pulse 123, respirations 193/118 mmHg and O2 sat 97% on room air.  The patient received 324 mg of chewable aspirin and labetalol 10 mg IVP x1.  I added potassium 40 mEq p.o. x1 dose.  White count was 21.1, hemoglobin 15.5 g/dL and platelets 811.  D-dimer was 0.65 mcg/mL.  Lipase was 36.  First troponin was normal.  AST showed a ALT level of 55 units/L, but otherwise was normal.  BMP shows a glucose of 189 mg/dL, but is otherwise within normal limits.  Magnesium and phosphorus are normal. EKG sinus tachycardia 120 bpm, low voltage, with baseline wander.  Chest radiograph shows mild bronchitic change.  Imaging: CTA chest was nondiagnostic due to contrast bolus timing.  No central bronchial thickening, may be smoking related or secondary to bronchitis.  Prominent mediastinal and axillary nodes, likely reactive.  There is a pleural-based nodularity in the left lower  lobe base and right middle lobe, suggesting postinfectious or postinflammatory scaring.  CT follow-up in a year is recommended due to the history of tobacco smoking.  Review of Systems: As per HPI otherwise 10 point review of systems negative.   Past Medical History:  Diagnosis Date  . Diabetes mellitus without complication (HCC)   . Leukocytosis 01/20/2018  . Obesity, Class III, BMI 40-49.9 (morbid obesity) (HCC) 01/20/2018  . Tobacco abuse 07/26/2011  . Uncontrolled hypertension 01/20/2018   Past Surgical History:  Procedure Laterality Date  . ABDOMINAL SURGERY    . APPENDECTOMY    . CHOLECYSTECTOMY    . PILONIDAL CYST EXCISION       reports that he has been smoking cigarettes. He has been smoking about 1.00 pack per day. He has never used smokeless tobacco. He reports current alcohol use of about 1.0 standard drinks of alcohol per week. He reports that he does not use drugs.  Allergies  Allergen Reactions  . Tramadol     Hallucinations    Family History  Problem Relation Age of Onset  . Diabetes Father   . CAD Father   . Hypertension Father   . Hypertension Brother   . CAD Brother   . Diabetes Paternal Uncle   . CAD Paternal Uncle   . Hypertension Paternal Uncle   . Diabetes Paternal Uncle   . CAD Paternal Uncle   . Hypertension Paternal Uncle   . Diabetes Paternal Uncle   . CAD Paternal Uncle   . Hypertension Paternal Uncle    Prior to Admission medications   Medication Sig Start Date  End Date Taking? Authorizing Provider  metFORMIN (GLUCOPHAGE) 500 MG tablet Take 500 mg by mouth 2 (two) times daily with a meal.    [provider]  ondansetron (ZOFRAN) 4 MG tablet Take 1 tablet (4 mg total) by mouth every 6 (six) hours. 01/10/16   Maia Plan, MD    Physical Exam: Vitals:   01/20/18 0230 01/20/18 0400 01/20/18 0430 01/20/18 0500  BP: 139/67 (!) 149/61 (!) 144/104 (!) 154/95  Pulse: 91 96 86 84  Resp: (!) 22 20 (!) 24 (!) 23  Temp:      TempSrc:        SpO2: 96% 93% 94% 95%  Weight:      Height:        Constitutional: NAD, calm, comfortable Eyes: PERRL, lids and conjunctivae normal ENMT: Mucous membranes are moist. Posterior pharynx clear of any exudate or lesions. Neck: normal, supple, no masses, no thyromegaly Respiratory: clear to auscultation bilaterally, no wheezing, no crackles. Normal respiratory effort. No accessory muscle use.  Cardiovascular: Regular rate and rhythm, no murmurs / rubs / gallops. No extremity edema. 2+ pedal pulses. No carotid bruits.  Abdomen: Obese, soft, no tenderness, no masses palpated. No hepatosplenomegaly. Bowel sounds positive.  Musculoskeletal: no clubbing / cyanosis. No joint deformity upper and lower extremities. Good ROM, no contractures. Normal muscle tone.  Skin: no rashes, lesions, ulcers. No induration Neurologic: CN 2-12 grossly intact. Sensation intact, DTR normal. Strength 5/5 in all 4.  Psychiatric: Normal judgment and insight. Alert and oriented x 3. Normal mood.   Labs on Admission: I have personally reviewed following labs and imaging studies  CBC: Recent Labs  Lab 01/20/18 0012  WBC 21.1*  HGB 15.5  HCT 48.7  MCV 88.2  PLT 323   Basic Metabolic Panel: Recent Labs  Lab 01/20/18 0012  NA 134*  K 3.5  CL 100  CO2 25  GLUCOSE 189*  BUN 11  CREATININE 1.08  CALCIUM 9.2  MG 2.2  PHOS 3.5   GFR: Estimated Creatinine Clearance: 133.2 mL/min (by C-G formula based on SCr of 1.08 mg/dL). Liver Function Tests: Recent Labs  Lab 01/20/18 0012  AST 34  ALT 55*  ALKPHOS 100  BILITOT 0.5  PROT 7.6  ALBUMIN 4.1   Recent Labs  Lab 01/20/18 0012  LIPASE 36   No results for input(s): AMMONIA in the last 168 hours. Coagulation Profile: No results for input(s): INR, PROTIME in the last 168 hours. Cardiac Enzymes: Recent Labs  Lab 01/20/18 0012  TROPONINI <0.03   BNP (last 3 results) No results for input(s): PROBNP in the last 8760 hours. HbA1C: No results for  input(s): HGBA1C in the last 72 hours. CBG: No results for input(s): GLUCAP in the last 168 hours. Lipid Profile: No results for input(s): CHOL, HDL, LDLCALC, TRIG, CHOLHDL, LDLDIRECT in the last 72 hours. Thyroid Function Tests: No results for input(s): TSH, T4TOTAL, FREET4, T3FREE, THYROIDAB in the last 72 hours. Anemia Panel: No results for input(s): VITAMINB12, FOLATE, FERRITIN, TIBC, IRON, RETICCTPCT in the last 72 hours. Urine analysis: No results found for: COLORURINE, APPEARANCEUR, LABSPEC, PHURINE, GLUCOSEU, HGBUR, BILIRUBINUR, KETONESUR, PROTEINUR, UROBILINOGEN, NITRITE, LEUKOCYTESUR  Radiological Exams on Admission: Dg Chest 2 View  Result Date: 01/20/2018 CLINICAL DATA:  Intermittent left chest pain for 2 weeks. EXAM: CHEST - 2 VIEW COMPARISON:  Radiographs 04/02/2009 FINDINGS: The cardiomediastinal contours are normal. Mild bronchitic change. Pulmonary vasculature is normal. No consolidation, pleural effusion, or pneumothorax. No acute osseous abnormalities are seen. IMPRESSION:  Mild bronchitic change which may be related smoking or bronchitis. Electronically Signed   By: Narda Rutherford M.D.   On: 01/20/2018 01:11   Ct Angio Chest Pe W And/or Wo Contrast  Result Date: 01/20/2018 CLINICAL DATA:  Chest pain for 2 weeks, worsened tonight now radiating down left arm. PE suspected, intermediate prob, positive D-dimer EXAM: CT ANGIOGRAPHY CHEST WITH CONTRAST TECHNIQUE: Multidetector CT imaging of the chest was performed using the standard protocol during bolus administration of intravenous contrast. Multiplanar CT image reconstructions and MIPs were obtained to evaluate the vascular anatomy. Contrast bolus was suboptimal for pulmonary artery evaluation. Repeat injection was offered, however referring clinician declined at this time. CONTRAST:  ISOVUE-370 IOPAMIDOL (ISOVUE-370) INJECTION 76% COMPARISON:  Radiographs earlier this day. FINDINGS: Cardiovascular: Evaluation is  nondiagnostic for evaluation of pulmonary embolus due to contrast bolus timing. The thoracic aorta is normal in caliber without dissection or aneurysm. Heart is normal in size. No pericardial effusion. Mediastinum/Nodes: Multiple small highest mediastinal bilateral axillary nodes are not enlarged by size criteria. No hilar adenopathy. Esophagus is nondistended. No visualized thyroid nodule. Lungs/Pleura: Moderate central bronchial thickening. No focal consolidation. 7 mm pleural-based nodule in the left lower lobe, image 97 series 9. Additional pleural based nodularity more inferiorly in the left lower lobe as well as anteriorly in the right middle lobe. No pulmonary edema or pleural fluid. Upper Abdomen: Advanced hepatic steatosis.  Postcholecystectomy. Musculoskeletal: There are no acute or suspicious osseous abnormalities. Review of the MIP images confirms the above findings. IMPRESSION: 1. Nondiagnostic evaluation of the pulmonary arteries for pulmonary embolus due to contrast bolus timing. Consider repeat exam versus V/Q scan if there is persistent clinical concern for pulmonary embolus. 2. Normal thoracic aorta without dissection. 3. Central bronchial thickening, may be smoking related or secondary to bronchitis. 4. Prominent highest mediastinal and bilateral axillary lymph nodes, likely reactive and do not meet size criteria for further evaluation. 5. Pleural-based nodularity in the left lower lobe and right middle lobe, suggesting post infectious or post inflammatory scarring. Given smoking history, follow-up CT could be considered in 1 year. Electronically Signed   By: Narda Rutherford M.D.   On: 01/20/2018 02:10    EKG: Independently reviewed. Vent. rate 120 BPM PR interval * ms QRS duration 96 ms QT/QTc 325/460 ms P-R-T axes 34 -13 69 Sinus tachycardia Probable left atrial enlargement Low voltage, precordial leads Baseline wander in lead(s) II III aVF No previous EKG to compare  to.  Assessment/Plan Principal Problem:   Chest pain Observation/telemetry. Supplemental oxygen as needed. Nitroglycerin sublingually as needed. Continue blood pressure control. Trend troponin levels. Check VQ scan. Check echocardiogram.  Active Problems:   Tobacco abuse Nicotine replacement therapy offered. Staff to provide tobacco cessation information.    Diabetes mellitus without complication (HCC) Carbohydrate modified diet. Check hemoglobin A1c. CBG monitoring before meals and bedtime. Lifestyle modifications stressed.    Uncontrolled hypertension Start lisinopril 5 mg p.o. daily. Continue labetalol 10 mg IVP every 2 hours as needed. Smoking cessation was advised.    Obesity, Class III, BMI 40-49.9 (morbid obesity) (HCC) Advised and encouraged the patient to make significant lifestyle changes.    Leukocytosis No fever or any other signs of infection. Follow-up CBC later today. Should follow-up with hematology as an outpatient.   DVT prophylaxis: Lovenox SQ. Code Status: Full code. Family Communication: None at bedside. Disposition Plan: Observation for troponin level trending, VQ scan and echocardiogram. Consults called: Admission status: Observation/telemetry.   Bobette Mo MD [facility]  If 7PM-7AM, please contact night-coverage [email or web address] Password [email or web address]  01/20/2018, 5:59 AM   This document was prepared using Dragon voice recognition software and may contain some unintended transcription errors.

## 2018-01-20 NOTE — ED Notes (Signed)
Hospitalist at bedside 

## 2018-01-20 NOTE — ED Notes (Signed)
Per hospitalist pt can be discharged home after echo.    Pt aware.  Soda given per pt request.  Echo in progress.

## 2018-01-20 NOTE — Discharge Summary (Signed)
Physician Discharge Summary  Patient ID: Roy CanterburyBruce A Manfredo MRN: [id number] DOB/AGE: 05/21/1980 38 y.o.  Admit date: 01/20/2018 Discharge date: 01/20/2018  Admission Diagnoses: Principal Problem:   Chest pain Active Problems:   Tobacco abuse   Diabetes mellitus without complication (HCC)   Uncontrolled hypertension   Obesity, Class III, BMI 40-49.9 (morbid obesity) (HCC)   Leukocytosis  Discharge Diagnoses:  Principal Problem:   Chest pain Active Problems:   Tobacco abuse   Diabetes mellitus without complication (HCC)   Uncontrolled hypertension   Obesity, Class III, BMI 40-49.9 (morbid obesity) (HCC)   Leukocytosis   Discharged Condition: good  Presentation Summary: Roy CanterburyBruce A Vankleeck is a 38 y.o. male with medical history significant of type 2 diabetes, morbid obesity, tobacco use, hypertension, leukocytosis who is coming to the emergency department with complaints of intermittent chest pain, pressure-like, radiated to his left arm, associated with mild dyspnea, that lasts 20 to 30 minutes at a time.  He denies dizziness, nausea, emesis, diaphoresis, PND, orthopnea or recent pitting edema of the lower extremities.  He denies headache, fever, chills, sore throat, wheezing, hemoptysis, abdominal pain, diarrhea, constipation, melena or hematochezia.  No dysuria, frequency or hematuria.  No heat or cold intolerance.  No polyuria, polydipsia, polyphagia or blurred vision.  Denies skin rashes or pruritus. ED Course: Initial vital signs temperature 98 F, pulse 123, respirations 193/118 mmHg and O2 sat 97% on room air.  The patient received 324 mg of chewable aspirin and labetalol 10 mg IVP x1.  I added potassium 40 mEq p.o. x1 dose. White count was 21.1, hemoglobin 15.5 g/dL and platelets 409323.  D-dimer was 0.65 mcg/mL.  Lipase was 36.  First troponin was normal.  AST showed a ALT level of 55 units/L, but otherwise was normal.  BMP shows a glucose of 189 mg/dL, but is otherwise within normal limits.   Magnesium and phosphorus are normal. EKG sinus tachycardia 120 bpm, low voltage, with baseline wander.  Chest radiograph shows mild bronchitic change. Imaging: CTA chest was nondiagnostic due to contrast bolus timing.  No central bronchial thickening, may be smoking related or secondary to bronchitis.  Prominent mediastinal and axillary nodes, likely reactive.  There is a pleural-based nodularity in the left lower lobe base and right middle lobe, suggesting postinfectious or postinflammatory scaring.  CT follow-up in a year is recommended due to the history of tobacco smoking.   Hospital Course/ Problems   Chest pain: patient presented w/ 2 wk hx of CP off and on, no hx of same, no risk factors for CAD other than family hx (father CABG in mid 4550's).  Eval w/ EKG and trop's were all negative, V/Q scan was negative (CTA chest was not considered properly done due to contrast mistiming). ECHO done, does not need to wait for results. No evidence of acute MI.  Have d/w cardiology, no need for further inpt testing, can see cardiology in OP setting for further evaluation vs f/u with PCP. Gave pt the options, he is in process of trying to find a PCP.  Can call for cardiology appt if desired (see F/U section).     Tobacco abuse Nicotine replacement therapy offered. Staff to provide tobacco cessation information.   ? Diabetes mellitus: patient is not taking any medications for DM, says recently his A1C was measured at 7.  Pt can f/u w/ PCP about this.     Uncontrolled hypertension: BP's were 160/ 100 on presentation here, improved w/ IV labetalol and po ACEi.  -  will start lisinopril 5 mg p.o. daily give 6mos Rx, pt needs PCP to f/u    Obesity, Class III, BMI 40-49.9 (morbid obesity) (HCC) Advised and encouraged the patient to make significant lifestyle changes.    Leukocytosis No fever or any other signs of infection. Follow-up CBC was down from 23k > 15k.  Should follow-up with PCP to recheck  this   Discharge Exam: Blood pressure (!) 110/92, pulse 84, temperature 98.2 F (36.8 C), temperature source Oral, resp. rate 14, height 5\' 11"  (1.803 m), weight 136.1 kg, SpO2 97 %. Alert, no distress Neck no jvd or bruits Chest cta bilat no rales or wheezing Cor reg no MRG Abd soft ntnd no mass or hsm Ext no sig LE edema , no wounds Nonfocal , Ox 3  Disposition: Discharge disposition: 01-Home or Self Care        Allergies as of 01/20/2018      Reactions   Coconut Fatty Acids    Metformin And Related Nausea Only   Tramadol    Hallucinations      Medication List    TAKE these medications   lisinopril 5 MG tablet Commonly known as:  PRINIVIL,ZESTRIL Take 1 tablet (5 mg total) by mouth daily. Start taking on:  January 21, 2018      Follow-up Information    End, Cristal Deer, MD. Schedule an appointment as soon as possible for a visit.   Specialty:  Cardiology Why:  Please call to make appt for cardiologist Contact information: [address] [place] [address] [phone]           Signed: Maree Krabbe 01/20/2018, 2:01 PM

## 2018-01-20 NOTE — ED Notes (Signed)
Patient transported to X-ray 

## 2018-01-20 NOTE — Progress Notes (Signed)
*  PRELIMINARY RESULTS* Echocardiogram 2D Echocardiogram has been performed.  Roy [name] 01/20/2018, 1:49 PM

## 2018-01-20 NOTE — ED Notes (Signed)
Advised patient of NPO status. Patient verbalized understanding.  

## 2018-01-20 NOTE — ED Notes (Addendum)
Gave patient meal tray as approved by MD.

## 2018-01-21 LAB — HIV ANTIBODY (ROUTINE TESTING W REFLEX): HIV Screen 4th Generation wRfx: NONREACTIVE

## 2018-02-14 DIAGNOSIS — I110 Hypertensive heart disease with heart failure: Secondary | ICD-10-CM | POA: Diagnosis not present

## 2018-02-14 DIAGNOSIS — R05 Cough: Secondary | ICD-10-CM | POA: Diagnosis not present

## 2018-02-14 DIAGNOSIS — R5383 Other fatigue: Secondary | ICD-10-CM | POA: Diagnosis not present

## 2018-02-14 DIAGNOSIS — E1165 Type 2 diabetes mellitus with hyperglycemia: Secondary | ICD-10-CM | POA: Diagnosis not present

## 2018-02-14 DIAGNOSIS — D510 Vitamin B12 deficiency anemia due to intrinsic factor deficiency: Secondary | ICD-10-CM | POA: Diagnosis not present

## 2018-02-14 DIAGNOSIS — R112 Nausea with vomiting, unspecified: Secondary | ICD-10-CM | POA: Diagnosis not present

## 2018-02-14 DIAGNOSIS — E7849 Other hyperlipidemia: Secondary | ICD-10-CM | POA: Diagnosis not present

## 2018-02-19 DIAGNOSIS — R5382 Chronic fatigue, unspecified: Secondary | ICD-10-CM | POA: Diagnosis not present

## 2018-02-19 DIAGNOSIS — E1165 Type 2 diabetes mellitus with hyperglycemia: Secondary | ICD-10-CM | POA: Diagnosis not present

## 2018-02-19 DIAGNOSIS — E662 Morbid (severe) obesity with alveolar hypoventilation: Secondary | ICD-10-CM | POA: Diagnosis not present

## 2018-02-19 DIAGNOSIS — I119 Hypertensive heart disease without heart failure: Secondary | ICD-10-CM | POA: Diagnosis not present

## 2018-03-20 DIAGNOSIS — I110 Hypertensive heart disease with heart failure: Secondary | ICD-10-CM | POA: Diagnosis not present

## 2018-03-20 DIAGNOSIS — F17200 Nicotine dependence, unspecified, uncomplicated: Secondary | ICD-10-CM | POA: Diagnosis not present

## 2018-03-20 DIAGNOSIS — E1165 Type 2 diabetes mellitus with hyperglycemia: Secondary | ICD-10-CM | POA: Diagnosis not present

## 2018-03-20 DIAGNOSIS — J449 Chronic obstructive pulmonary disease, unspecified: Secondary | ICD-10-CM | POA: Diagnosis not present

## 2018-03-28 DIAGNOSIS — Z716 Tobacco abuse counseling: Secondary | ICD-10-CM | POA: Diagnosis not present

## 2018-03-28 DIAGNOSIS — F17210 Nicotine dependence, cigarettes, uncomplicated: Secondary | ICD-10-CM | POA: Diagnosis not present

## 2018-04-06 DIAGNOSIS — Z716 Tobacco abuse counseling: Secondary | ICD-10-CM | POA: Diagnosis not present

## 2018-04-06 DIAGNOSIS — Z008 Encounter for other general examination: Secondary | ICD-10-CM | POA: Diagnosis not present

## 2018-04-06 DIAGNOSIS — E119 Type 2 diabetes mellitus without complications: Secondary | ICD-10-CM | POA: Diagnosis not present

## 2018-04-06 DIAGNOSIS — Z720 Tobacco use: Secondary | ICD-10-CM | POA: Diagnosis not present

## 2018-04-06 DIAGNOSIS — I10 Essential (primary) hypertension: Secondary | ICD-10-CM | POA: Diagnosis not present

## 2018-04-11 DIAGNOSIS — F17210 Nicotine dependence, cigarettes, uncomplicated: Secondary | ICD-10-CM | POA: Diagnosis not present

## 2018-04-11 DIAGNOSIS — Z716 Tobacco abuse counseling: Secondary | ICD-10-CM | POA: Diagnosis not present

## 2018-05-22 DIAGNOSIS — E662 Morbid (severe) obesity with alveolar hypoventilation: Secondary | ICD-10-CM | POA: Diagnosis not present

## 2018-05-22 DIAGNOSIS — E1165 Type 2 diabetes mellitus with hyperglycemia: Secondary | ICD-10-CM | POA: Diagnosis not present

## 2018-05-22 DIAGNOSIS — R5382 Chronic fatigue, unspecified: Secondary | ICD-10-CM | POA: Diagnosis not present

## 2018-05-22 DIAGNOSIS — I110 Hypertensive heart disease with heart failure: Secondary | ICD-10-CM | POA: Diagnosis not present

## 2020-02-17 IMAGING — NM NM PULMONARY VENT & PERF
13 series · 13 of 13 positions shown · non-contrast
Comparison: Chest x-ray as well as chest CT today.

CLINICAL DATA: Chest pain 2 weeks worse over the past few days with
arm pain and shortness-of-breath.

EXAM:
NUCLEAR MEDICINE VENTILATION - PERFUSION LUNG SCAN
TECHNIQUE: Ventilation images were obtained in multiple projections using
inhaled aerosol Wc-AAm DTPA. Perfusion images were obtained in
multiple projections after intravenous injection of Wc-AAm MAA.
RADIOPHARMACEUTICALS:  31.0 mCi of Wc-AAm DTPA aerosol inhalation
and 4.4 mCi 2c00m MAA IV

[Series 2: lao/rpo vent · 4.14mm/px · 1 of 1 slices shown (1 of 2)]
[im 1/1]
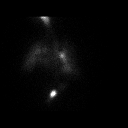

[Series 2: lao/rpo vent · 4.14mm/px · 1 of 1 slices shown (2 of 2)]
[im 1/1]
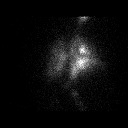

[Series 3: lt lat/rt lat vent · 4.14mm/px · 1 of 1 slices shown (1 of 2)]
[im 1/1  full-range]
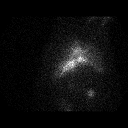

[Series 3: lt lat/rt lat vent · 4.14mm/px · 1 of 1 slices shown (2 of 2)]
[im 1/1]
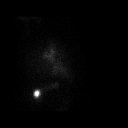

[Series 4: lpo/rao vent · 4.14mm/px · 1 of 1 slices shown (1 of 2)]
[im 1/1]
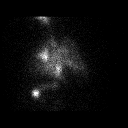

[Series 4: lpo/rao vent · 4.14mm/px · 1 of 1 slices shown (2 of 2)]
[im 1/1]
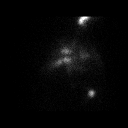

[Series 5: ant/post perf · 4.14mm/px · 1 of 1 slices shown]
[im 1/1]
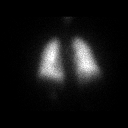

[Series 6: lao/rpo perf · 4.14mm/px · 1 of 1 slices shown (1 of 2)]
[im 1/1]
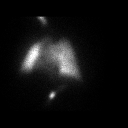

[Series 6: lao/rpo perf · 4.14mm/px · 1 of 1 slices shown (2 of 2)]
[im 1/1]
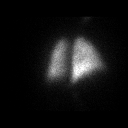

[Series 7: lt lat/rt lat perf · 4.14mm/px · 1 of 1 slices shown (1 of 2)]
[im 1/1]
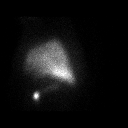

[Series 7: lt lat/rt lat perf · 4.14mm/px · 1 of 1 slices shown (2 of 2)]
[im 1/1]
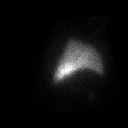

[Series 8: lpo/rao perf · 4.14mm/px · 1 of 1 slices shown (1 of 2)]
[im 1/1]
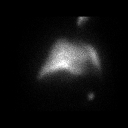

[Series 8: lpo/rao perf · 4.14mm/px · 1 of 1 slices shown (2 of 2)]
[im 1/1]
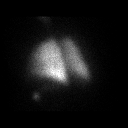

[13 of 13 positions shown; findings below may reference images not displayed]

FINDINGS: Ventilation: No focal ventilation defect. Mild clumping of
radiotracer within the central airways.

Perfusion: No wedge shaped peripheral perfusion defects to suggest
acute pulmonary embolism.
IMPRESSION: Normal ventilation perfusion lung scan without evidence of pulmonary
embolism.
